# Patient Record
Sex: Male | Born: 1955 | Race: Black or African American | Hispanic: No | Marital: Single | Smoking: Former smoker
Health system: Southern US, Community
[De-identification: ages and names within clinical notes are randomized; demographics above are authoritative.]

## PROBLEM LIST (undated history)

## (undated) DIAGNOSIS — I10 Essential (primary) hypertension: Secondary | ICD-10-CM

## (undated) HISTORY — PX: HYDROCELE EXCISION / REPAIR: SUR1145

## (undated) HISTORY — PX: TONSILLECTOMY: SUR1361

---

## 2004-11-03 ENCOUNTER — Emergency Department: Payer: Self-pay | Admitting: Emergency Medicine

## 2005-01-27 ENCOUNTER — Emergency Department: Payer: Self-pay | Admitting: Emergency Medicine

## 2005-01-27 ENCOUNTER — Other Ambulatory Visit: Payer: Self-pay

## 2007-10-19 ENCOUNTER — Emergency Department: Payer: Self-pay | Admitting: Internal Medicine

## 2010-03-09 ENCOUNTER — Emergency Department: Payer: Self-pay | Admitting: Internal Medicine

## 2012-09-13 ENCOUNTER — Emergency Department: Payer: Self-pay | Admitting: Emergency Medicine

## 2013-09-07 ENCOUNTER — Emergency Department: Payer: Self-pay | Admitting: Emergency Medicine

## 2013-09-07 LAB — CBC
HCT: 40.4 % (ref 40.0–52.0)
HGB: 13.5 g/dL (ref 13.0–18.0)
MCH: 33.8 pg (ref 26.0–34.0)
MCHC: 33.4 g/dL (ref 32.0–36.0)
MCV: 101 fL — AB (ref 80–100)
Platelet: 275 10*3/uL (ref 150–440)
RBC: 4 10*6/uL — ABNORMAL LOW (ref 4.40–5.90)
RDW: 12.5 % (ref 11.5–14.5)
WBC: 15 10*3/uL — ABNORMAL HIGH (ref 3.8–10.6)

## 2013-09-07 LAB — COMPREHENSIVE METABOLIC PANEL
ANION GAP: 4 — AB (ref 7–16)
Albumin: 3.9 g/dL (ref 3.4–5.0)
Alkaline Phosphatase: 90 U/L
BUN: 21 mg/dL — ABNORMAL HIGH (ref 7–18)
Bilirubin,Total: 0.4 mg/dL (ref 0.2–1.0)
Calcium, Total: 9.1 mg/dL (ref 8.5–10.1)
Chloride: 103 mmol/L (ref 98–107)
Co2: 31 mmol/L (ref 21–32)
Creatinine: 1.17 mg/dL (ref 0.60–1.30)
EGFR (African American): >60
EGFR (Non-African Amer.): >60
GLUCOSE: 113 mg/dL — AB (ref 65–99)
Osmolality: 279 (ref 275–301)
Potassium: 4.6 mmol/L (ref 3.5–5.1)
SGOT(AST): 27 U/L (ref 15–37)
SGPT (ALT): 23 U/L (ref 12–78)
SODIUM: 138 mmol/L (ref 136–145)
Total Protein: 7.5 g/dL (ref 6.4–8.2)

## 2013-09-07 LAB — TROPONIN I: Troponin-I: <0.02 ng/mL

## 2014-12-30 ENCOUNTER — Emergency Department: Payer: No Typology Code available for payment source

## 2014-12-30 ENCOUNTER — Encounter: Payer: Self-pay | Admitting: Emergency Medicine

## 2014-12-30 ENCOUNTER — Emergency Department
Admission: EM | Admit: 2014-12-30 | Discharge: 2014-12-30 | Disposition: A | Discharge status: Home / Self Care | Payer: No Typology Code available for payment source | Attending: Emergency Medicine | Admitting: Emergency Medicine

## 2014-12-30 DIAGNOSIS — Z87891 Personal history of nicotine dependence: Secondary | ICD-10-CM | POA: Insufficient documentation

## 2014-12-30 DIAGNOSIS — Y998 Other external cause status: Secondary | ICD-10-CM | POA: Insufficient documentation

## 2014-12-30 DIAGNOSIS — G44219 Episodic tension-type headache, not intractable: Secondary | ICD-10-CM

## 2014-12-30 DIAGNOSIS — S161XXA Strain of muscle, fascia and tendon at neck level, initial encounter: Secondary | ICD-10-CM

## 2014-12-30 DIAGNOSIS — S8992XA Unspecified injury of left lower leg, initial encounter: Secondary | ICD-10-CM | POA: Diagnosis not present

## 2014-12-30 DIAGNOSIS — Z79899 Other long term (current) drug therapy: Secondary | ICD-10-CM | POA: Insufficient documentation

## 2014-12-30 DIAGNOSIS — S59902A Unspecified injury of left elbow, initial encounter: Secondary | ICD-10-CM | POA: Insufficient documentation

## 2014-12-30 DIAGNOSIS — Y9389 Activity, other specified: Secondary | ICD-10-CM | POA: Insufficient documentation

## 2014-12-30 DIAGNOSIS — I10 Essential (primary) hypertension: Secondary | ICD-10-CM | POA: Diagnosis not present

## 2014-12-30 DIAGNOSIS — S39012A Strain of muscle, fascia and tendon of lower back, initial encounter: Secondary | ICD-10-CM | POA: Insufficient documentation

## 2014-12-30 DIAGNOSIS — S199XXA Unspecified injury of neck, initial encounter: Secondary | ICD-10-CM | POA: Diagnosis present

## 2014-12-30 DIAGNOSIS — Y92410 Unspecified street and highway as the place of occurrence of the external cause: Secondary | ICD-10-CM | POA: Insufficient documentation

## 2014-12-30 HISTORY — DX: Essential (primary) hypertension: I10

## 2014-12-30 MED ORDER — NAPROXEN 500 MG PO TBEC: 500.0000 mg | DELAYED_RELEASE_TABLET | Freq: Two times a day (BID) | ORAL | Status: DC

## 2014-12-30 MED ORDER — CYCLOBENZAPRINE HCL 5 MG PO TABS: 5.0000 mg | ORAL_TABLET | Freq: Three times a day (TID) | ORAL | Status: DC | PRN

## 2014-12-30 MED ORDER — ACETAMINOPHEN 500 MG PO TABS
1000.0000 mg | ORAL_TABLET | Freq: Once | ORAL | Status: AC
  Administered 2014-12-30: 1000 mg via ORAL
  Filled 2014-12-30: qty 2
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
## 2014-12-30 NOTE — Discharge Instructions (Signed)
Cervical Sprain A cervical sprain is when the tissues (ligaments) that hold the neck bones in place stretch or tear. HOME CARE   Put ice on the injured area.  Put ice in a plastic bag.  Place a towel between your skin and the bag.  Leave the ice on for 15-20 minutes, 3-4 times a day.  You may have been given a collar to wear. This collar keeps your neck from moving while you heal.  Do not take the collar off unless told by your doctor.  If you have long hair, keep it outside of the collar.  Ask your doctor before changing the position of your collar. You may need to change its position over time to make it more comfortable.  If you are allowed to take off the collar for cleaning or bathing, follow your doctor's instructions on how to do it safely.  Keep your collar clean by wiping it with mild soap and water. Dry it completely. If the collar has removable pads, remove them every 1-2 days to hand wash them with soap and water. Allow them to air dry. They should be dry before you wear them in the collar.  Do not drive while wearing the collar.  Only take medicine as told by your doctor.  Keep all doctor visits as told.  Keep all physical therapy visits as told.  Adjust your work station so that you have good posture while you work.  Avoid positions and activities that make your problems worse.  Warm up and stretch before being active. GET HELP IF:  Your pain is not controlled with medicine.  You cannot take less pain medicine over time as planned.  Your activity level does not improve as expected. GET HELP RIGHT AWAY IF:   You are bleeding.  Your stomach is upset.  You have an allergic reaction to your medicine.  You develop new problems that you cannot explain.  You lose feeling (become numb) or you cannot move any part of your body (paralysis).  You have tingling or weakness in any part of your body.  Your symptoms get worse. Symptoms include:  Pain,  soreness, stiffness, puffiness (swelling), or a burning feeling in your neck.  Pain when your neck is touched.  Shoulder or upper back pain.  Limited ability to move your neck.  Headache.  Dizziness.  Your hands or arms feel week, lose feeling, or tingle.  Muscle spasms.  Difficulty swallowing or chewing. MAKE SURE YOU:   Understand these instructions.  Will watch your condition.  Will get help right away if you are not doing well or get worse.   This information is not intended to replace advice given to you by your health care provider. Make sure you discuss any questions you have with your health care provider.   Document Released: 08/26/2007 Document Revised: 11/09/2012 Document Reviewed: 09/14/2012 Elsevier Interactive Patient Education 2016 Else[employer] Inc[employer]  Lumbosacral Strain Lumbosacral strain is a strain of any of the parts that make up your lumbosacral vertebrae. Your lumbosacral vertebrae are the bones that make up the lower third of your backbone. Your lumbosacral vertebrae are held together by muscles and tough, fibrous tissue (ligaments).  CAUSES  A sudden blow to your back can cause lumbosacral strain. Also, anything that causes an excessive stretch of the muscles in the low back can cause this strain. This is typically seen when people exert themselves strenuously, fall, lift heavy objects, bend, or crouch repeatedly. RISK FACTORS  Physically demanding  work. °· Participation in pushing or pulling sports or sports that require a sudden twist of the back (tennis, golf, baseball). °· Weight lifting. °· Excessive lower back curvature. °· Forward-tilted pelvis. °· Weak back or abdominal muscles or both. °· Tight hamstrings. °SIGNS AND SYMPTOMS  °Lumbosacral strain may cause pain in the area of your injury or pain that moves (radiates) down your leg.  °DIAGNOSIS °Your health care provider can often diagnose lumbosacral strain through a physical exam. In some cases, you may  need tests such as X-ray exams.  °TREATMENT  °Treatment for your lower back injury depends on many factors that your clinician will have to evaluate. However, most treatment will include the use of anti-inflammatory medicines. °HOME CARE INSTRUCTIONS  °· Avoid hard physical activities (tennis, racquetball, waterskiing) if you are not in proper physical condition for it. This may aggravate or create problems. °· If you have a back problem, avoid sports requiring sudden body movements. Swimming and walking are generally safer activities. °· Maintain good posture. °· Maintain a healthy weight. °· For acute conditions, you may put ice on the injured area. °· Put ice in a plastic bag. °· Place a towel between your skin and the bag. °· Leave the ice on for 20 minutes, 2-3 times a day. °· When the low back starts healing, stretching and strengthening exercises may be recommended. °SEEK MEDICAL CARE IF: °· Your back pain is getting worse. °· You experience severe back pain not relieved with medicines. °SEEK IMMEDIATE MEDICAL CARE IF:  °· You have numbness, tingling, weakness, or problems with the use of your arms or legs. °· There is a change in bowel or bladder control. °· You have increasing pain in any area of the body, including your belly (abdomen). °· You notice shortness of breath, dizziness, or feel faint. °· You feel sick to your stomach (nauseous), are throwing up (vomiting), or become sweaty. °· You notice discoloration of your toes or legs, or your feet get very cold. °MAKE SURE YOU:  °· Understand these instructions. °· Will watch your condition. °· Will get help right away if you are not doing well or get worse. °  °This information is not intended to replace advice given to you by your health care provider. Make sure you discuss any questions you have with your health care provider. °  °Document Released: 12/17/2004 Document Revised: 03/30/2014 Document Reviewed: 10/26/2012 °Elsevier Interactive Patient  Education ©2016 Elsevier Inc. ° °Motor Vehicle Collision °After a car crash (motor vehicle collision), it is normal to have bruises and sore muscles. The first 24 hours usually feel the worst. After that, you will likely start to feel better each day. °HOME CARE °· Put ice on the injured area. °¨ Put ice in a plastic bag. °¨ Place a towel between your skin and the bag. °¨ Leave the ice on for 15-20 minutes, 03-04 times a day. °· Drink enough fluids to keep your pee (urine) clear or pale yellow. °· Do not drink alcohol. °· Take a warm shower or bath 1 or 2 times a day. This helps your sore muscles. °· Return to activities as told by your doctor. Be careful when lifting. Lifting can make neck or back pain worse. °· Only take medicine as told by your doctor. Do not use aspirin. °GET HELP RIGHT AWAY IF:  °· Your arms or legs tingle, feel weak, or lose feeling (numbness). °· You have headaches that do not get better with medicine. °· You have   neck pain, especially in the middle of the back of your neck.  You cannot control when you pee (urinate) or poop (bowel movement).  Pain is getting worse in any part of your body.  You are short of breath, dizzy, or pass out (faint).  You have chest pain.  You feel sick to your stomach (nauseous), throw up (vomit), or sweat.  You have belly (abdominal) pain that gets worse.  There is blood in your pee, poop, or throw up.  You have pain in your shoulder (shoulder strap areas).  Your problems are getting worse. MAKE SURE YOU:   Understand these instructions.  Will watch your condition.  Will get help right away if you are not doing well or get worse.   This information is not intended to replace advice given to you by your health care provider. Make sure you discuss any questions you have with your health care provider.   Document Released: 08/26/2007 Document Revised: 06/01/2011 Document Reviewed: 08/06/2010 Elsevier Interactive Patient Education 2016  Elsevier Inc.  Tension Headache A tension headache is pain, pressure, or aching that is felt over the front and sides of your head. These headaches can last from 30 minutes to several days. HOME CARE Managing Pain  Take over-the-counter and prescription medicines only as told by your doctor.  Lie down in a dark, quiet room when you have a headache.  If directed, apply ice to your head and neck area:  Put ice in a plastic bag.  Place a towel between your skin and the bag.  Leave the ice on for 20 minutes, 2-3 times per day.  Use a heating pad or a hot shower to apply heat to your head and neck area as told by your doctor. Eating and Drinking  Eat meals on a regular schedule.  Do not drink a lot of alcohol.  Do not use a lot of caffeine, or stop using caffeine. General Instructions  Keep all follow-up visits as told by your doctor. This is important.  Keep a journal to find out if certain things bring on headaches. For example, write down:  What you eat and drink.  How much sleep you get.  Any change to your diet or medicines.  Try getting a massage, or doing other things that help you to relax.  Lessen stress.  Sit up straight. Do not tighten (tense) your muscles.  Do not use tobacco products. This includes cigarettes, chewing tobacco, or e-cigarettes. If you need help quitting, ask your doctor.  Exercise regularly as told by your doctor.  Get enough sleep. This may mean 7-9 hours of sleep. GET HELP IF:  Your symptoms are not helped by medicine.  You have a headache that feels different from your usual headache.  You feel sick to your stomach (nauseous) or you throw up (vomit).  You have a fever. GET HELP RIGHT AWAY IF:  Your headache becomes very bad.  You keep throwing up.  You have a stiff neck.  You have trouble seeing.  You have trouble speaking.  You have pain in your eye or ear.  Your muscles are weak or you lose muscle control.  You  lose your balance or you have trouble walking.  You feel like you will pass out (faint) or you pass out.  You have confusion.   This information is not intended to replace advice given to you by your health care provider. Make sure you discuss any questions you have with your health  care provider.   Document Released: 06/03/2009 Document Revised: 11/28/2014 Document Reviewed: 07/02/2014 Elsevier Interactive Patient Education [employer]  Take the prescription meds as directed. Follow-up with Dr. Clint Guy as needed.

## 2015-05-08 ENCOUNTER — Encounter: Payer: Self-pay | Admitting: Family Medicine

## 2015-05-21 ENCOUNTER — Encounter: Payer: Self-pay | Admitting: Urology

## 2015-05-21 ENCOUNTER — Ambulatory Visit (INDEPENDENT_AMBULATORY_CARE_PROVIDER_SITE_OTHER): Payer: BLUE CROSS/BLUE SHIELD | Admitting: Urology

## 2015-05-21 VITALS — BP 152/89 | HR 97 | Ht 70.0 in | Wt 232.8 lb

## 2015-05-21 DIAGNOSIS — R350 Frequency of micturition: Secondary | ICD-10-CM | POA: Diagnosis not present

## 2015-05-21 LAB — URINALYSIS, COMPLETE
BILIRUBIN UA: NEGATIVE
GLUCOSE, UA: NEGATIVE
Ketones, UA: NEGATIVE
Leukocytes, UA: NEGATIVE
Nitrite, UA: NEGATIVE
PROTEIN UA: NEGATIVE
RBC UA: NEGATIVE
Specific Gravity, UA: 1.02 (ref 1.005–1.030)
UUROB: 0.2 mg/dL (ref 0.2–1.0)
pH, UA: 5.5 (ref 5.0–7.5)

## 2015-05-21 LAB — MICROSCOPIC EXAMINATION
Bacteria, UA: NONE SEEN
Epithelial Cells (non renal): NONE SEEN /hpf (ref 0–10)
RBC, UA: NONE SEEN /hpf (ref 0–?)
WBC, UA: NONE SEEN /hpf (ref 0–?)

## 2015-05-21 LAB — BLADDER SCAN AMB NON-IMAGING

## 2015-05-21 NOTE — Progress Notes (Signed)
05/21/2015 10:40 AM   Elijah Wagner 02-Apr-1955 [id number]  Referring provider: No referring provider defined for this encounter.  Chief Complaint  Patient presents with  . Benign Prostatic Hypertrophy    New Patient    HPI: Elijah Wagner is a 60yo seen today for urinary frequency and nocturia.  He has nocturia 2-3x. He has frequency q2hr.  No hesistancy, urgency, incontinence. NO hematuria, dysuria.  He is not bothered by his symptoms. He is not interested in BPH meds He has occasional issues getting an erection which bothers him. He has not tried any medication. NO issues with CAD. He was recently diagnosed with HTN. No hx of diabetes. The pt says he is unsure whether he has had a PSA drawn  Father died at age 76 from leukemia.      PMH: Past Medical History  Diagnosis Date  . Hypertension     Surgical History: Past Surgical History  Procedure Laterality Date  . None      Home Medications:    Medication List       This list is accurate as of: 05/21/15 10:40 AM.  Always use your most recent med list.               cyclobenzaprine 5 MG tablet  Commonly known as:  FLEXERIL  Take 1 tablet (5 mg total) by mouth every 8 (eight) hours as needed for muscle spasms.     lisinopril 10 MG tablet  Commonly known as:  PRINIVIL,ZESTRIL  Take 10 mg by mouth daily.     naproxen 500 MG EC tablet  Commonly known as:  EC NAPROSYN  Take 1 tablet (500 mg total) by mouth 2 (two) times daily with a meal.        Allergies: No Known Allergies  Family History: Family History  Problem Relation Age of Onset  . Bladder Cancer Neg Hx   . Prostate cancer Neg Hx   . Kidney cancer Neg Hx     Social History:  reports that he has quit smoking. He does not have any smokeless tobacco history on file. He reports that he does not drink alcohol. His drug history is not on file.  ROS: UROLOGY Frequent Urination?: Yes Hard to postpone urination?: No Burning/pain with  urination?: No Get up at night to urinate?: Yes Leakage of urine?: No Urine stream starts and stops?: No Trouble starting stream?: No Do you have to strain to urinate?: No Blood in urine?: No Urinary tract infection?: No Sexually transmitted disease?: No Injury to kidneys or bladder?: No Painful intercourse?: No Weak stream?: No Erection problems?: No Penile pain?: No  Gastrointestinal Nausea?: No Vomiting?: No Indigestion/heartburn?: No Diarrhea?: No Constipation?: No  Constitutional Fever: No Night sweats?: No Weight loss?: No Fatigue?: No  Skin Skin rash/lesions?: No Itching?: No  Eyes Blurred vision?: No Double vision?: No  Ears/Nose/Throat Sore throat?: No Sinus problems?: Yes  Hematologic/Lymphatic Swollen glands?: No Easy bruising?: No  Cardiovascular Leg swelling?: No Chest pain?: No  Respiratory Cough?: No Shortness of breath?: No  Endocrine Excessive thirst?: No  Musculoskeletal Back pain?: Yes Joint pain?: No  Neurological Headaches?: No Dizziness?: No  Psychologic Depression?: No Anxiety?: No  Physical Exam: BP 152/89 mmHg  Pulse 97  Ht  (1.778 m)  Wt 105.597 kg (232 lb 12.8 oz)  BMI 33.40 kg/m2  Constitutional:  Alert and oriented, No acute distress. HEENT: [place] AT, moist mucus membranes.  Trachea midline, no masses. Cardiovascular: No clubbing, cyanosis, or  edema. Respiratory: Normal respiratory effort, no increased work of breathing. GI: Abdomen is soft, nontender, nondistended, no abdominal masses GU: No CVA tenderness. circumicsed phallus. No masses/lesions on penis, testis, scrotum. Prostate smooth 40g, no nodules, no induration Skin: No rashes, bruises or suspicious lesions. Lymph: No cervical or inguinal adenopathy. Neurologic: Grossly intact, no focal deficits, moving all 4 extremities. Psychiatric: Normal mood and affect.  Laboratory Data: Lab Results  Component Value Date   WBC 15.0* 09/07/2013   HGB  13.5 09/07/2013   HCT 40.4 09/07/2013   MCV 101* 09/07/2013   PLT 275 09/07/2013    Lab Results  Component Value Date   CREATININE 1.17 09/07/2013    No results found for: PSA  No results found for: TESTOSTERONE  No results found for: HGBA1C  Urinalysis No results found for: COLORURINE, APPEARANCEUR, LABSPEC, PHURINE, GLUCOSEU, HGBUR, BILIRUBINUR, KETONESUR, PROTEINUR, UROBILINOGEN, NITRITE, LEUKOCYTESUR  Pertinent Imaging: none  Assessment & Plan:    1. BPH with LUTS, urinary frequency -PSA today -RTC 1 year - Urinalysis, Complete - BLADDER SCAN AMB NON-IMAGING   No Follow-up on file.  Malen Gauze, MD  [facility] [address], [address] [place], [place] [address] [phone]

## 2015-05-22 LAB — PSA, TOTAL AND FREE
PSA FREE PCT: 32.2 %
PSA, Free: 0.29 ng/mL
Prostate Specific Ag, Serum: 0.9 ng/mL (ref 0.0–4.0)

## 2015-05-27 ENCOUNTER — Telehealth: Payer: Self-pay

## 2015-05-29 NOTE — Telephone Encounter (Signed)
Encounter open in error 

## 2016-05-20 ENCOUNTER — Ambulatory Visit: Payer: BLUE CROSS/BLUE SHIELD | Admitting: Urology

## 2016-06-02 ENCOUNTER — Ambulatory Visit: Payer: BLUE CROSS/BLUE SHIELD | Admitting: Urology

## 2016-07-01 ENCOUNTER — Ambulatory Visit: Payer: BLUE CROSS/BLUE SHIELD | Admitting: Urology

## 2016-07-15 NOTE — Progress Notes (Signed)
07/16/2016 10:41 AM   Elijah Wagner 15-Mar-1956 [id number]  Referring provider: Armando Gang, FNP [address] [place], [place] [address]  Chief Complaint  Patient presents with  . Urinary Frequency    1 year follow up   . Benign Prostatic Hypertrophy    HPI: 61 yo AAM with BPH and LU TS and ED who presents today for a one year follow up.  BPH WITH LUTS His IPSS score today is 1, which is mild lower urinary tract symptomatology. He is mixed with his quality life due to his urinary symptoms.  His major complains today are frequency.  He has had these symptoms for a long time.  He denies any dysuria, hematuria or suprapubic pain.  He also denies any recent fevers, chills, nausea or vomiting.  He does not have a family history of PCa.      IPSS    Row Name 07/16/16 1000         International Prostate Symptom Score   How often have you had the sensation of not emptying your bladder? Not at All     How often have you had to urinate less than every two hours? Less than 1 in 5 times     How often have you found you stopped and started again several times when you urinated? Not at All     How often have you found it difficult to postpone urination? Not at All     How often have you had a weak urinary stream? Not at All     How often have you had to strain to start urination? Not at All     How many times did you typically get up at night to urinate? None     Total IPSS Score 1       Quality of Life due to urinary symptoms   If you were to spend the rest of your life with your urinary condition just the way it is now how would you feel about that? Mixed        Score:  1-7 Mild 8-19 Moderate 20-35 Severe   Erectile dysfunction His SHIM score is 20, which is mild ED.   He has been having difficulty with erections for a while.   His major complaint is inconsitency with erections.  His libido is preserved.   His risk factors for ED are age, BPH and HTN.  He denies any  painful erections or curvatures with his erections.   He is still having having spontaneous erections.  He has tried Viagra in the past, but the results are equivocal.          SHIM    Row Name 07/16/16 1020         SHIM: Over the last 6 months:   How do you rate your confidence that you could get and keep an erection? Moderate     When you had erections with sexual stimulation, how often were your erections hard enough for penetration (entering your partner)? Most Times (much more than half the time)     During sexual intercourse, how often were you able to maintain your erection after you had penetrated (entered) your partner? Most Times (much more than half the time)     During sexual intercourse, how difficult was it to maintain your erection to completion of intercourse? Slightly Difficult     When you attempted sexual intercourse, how often was it satisfactory for you? Almost Always or Always  SHIM Total Score   SHIM 20        Score: 1-7 Severe ED 8-11 Moderate ED 12-16 Mild-Moderate ED 17-21 Mild ED 22-25 No ED     PMH: Past Medical History:  Diagnosis Date  . Hypertension     Surgical History: Past Surgical History:  Procedure Laterality Date  . HYDROCELE EXCISION / REPAIR    . TONSILLECTOMY      Home Medications:  Allergies as of 07/16/2016   No Known Allergies     Medication List       Accurate as of 07/16/16 10:41 AM. Always use your most recent med list.          cyclobenzaprine 5 MG tablet Commonly known as:  FLEXERIL Take 1 tablet (5 mg total) by mouth every 8 (eight) hours as needed for muscle spasms.   lisinopril 10 MG tablet Commonly known as:  PRINIVIL,ZESTRIL Take 10 mg by mouth daily.   naproxen 500 MG EC tablet Commonly known as:  EC NAPROSYN Take 1 tablet (500 mg total) by mouth 2 (two) times daily with a meal.       Allergies: No Known Allergies  Family History: Family History  Problem Relation Age of Onset  .  Leukemia Father   . Bladder Cancer Neg Hx   . Prostate cancer Neg Hx   . Kidney cancer Neg Hx     Social History:  reports that he quit smoking about 3 years ago. He has never used smokeless tobacco. He reports that he does not drink alcohol or use drugs.  ROS: UROLOGY Frequent Urination?: Yes Hard to postpone urination?: No Burning/pain with urination?: No Get up at night to urinate?: No Leakage of urine?: No Urine stream starts and stops?: No Trouble starting stream?: No Do you have to strain to urinate?: No Blood in urine?: No Urinary tract infection?: No Sexually transmitted disease?: No Injury to kidneys or bladder?: No Painful intercourse?: No Weak stream?: No Erection problems?: No Penile pain?: No  Gastrointestinal Nausea?: No Vomiting?: No Indigestion/heartburn?: No Diarrhea?: No Constipation?: No  Constitutional Fever: No Night sweats?: No Weight loss?: No Fatigue?: No  Skin Skin rash/lesions?: No Itching?: No  Eyes Blurred vision?: No Double vision?: No  Ears/Nose/Throat Sore throat?: No Sinus problems?: Yes  Hematologic/Lymphatic Swollen glands?: No Easy bruising?: No  Cardiovascular Leg swelling?: No Chest pain?: No  Respiratory Cough?: No Shortness of breath?: No  Endocrine Excessive thirst?: No  Musculoskeletal Back pain?: No Joint pain?: No  Neurological Headaches?: No Dizziness?: No  Psychologic Depression?: No Anxiety?: No  Physical Exam: BP 136/90   Pulse 85   Ht  (1.803 m)   Wt 232 lb 8 oz (105.5 kg)   BMI 32.43 kg/m   Constitutional: Well nourished. Alert and oriented, No acute distress. HEENT: [place] AT, moist mucus membranes. Trachea midline, no masses. Cardiovascular: No clubbing, cyanosis, or edema. Respiratory: Normal respiratory effort, no increased work of breathing. GI: Abdomen is soft, non tender, non distended, no abdominal masses. Liver and spleen not palpable.  No hernias appreciated.  Stool  sample for occult testing is not indicated.   GU: No CVA tenderness.  No bladder fullness or masses.  Patient with circumcised phallus.   Urethral meatus is patent.  No penile discharge. No penile lesions or rashes. Scrotum without lesions, cysts, rashes and/or edema.  Testicles are located scrotally bilaterally. No masses are appreciated in the testicles. Left and right epididymis are normal. Rectal: Patient with  normal sphincter  tone. Anus and perineum without scarring or rashes. No rectal masses are appreciated. Prostate is approximately 45 grams, no nodules are appreciated. Seminal vesicles are normal. Skin: No rashes, bruises or suspicious lesions. Lymph: No cervical or inguinal adenopathy. Neurologic: Grossly intact, no focal deficits, moving all 4 extremities. Psychiatric: Normal mood and affect.  Laboratory Data: PSA History  0.9 ng/mL on 05/21/2015   Lab Results  Component Value Date   WBC 15.0 (H) 09/07/2013   HGB 13.5 09/07/2013   HCT 40.4 09/07/2013   MCV 101 (H) 09/07/2013   PLT 275 09/07/2013    Lab Results  Component Value Date   CREATININE 1.17 09/07/2013     Assessment & Plan:    1. BPH with LUTS  - IPSS score is 1/3  - Continue conservative management, avoiding bladder irritants and timed voiding's  - most bothersome symptoms is/are frequency  - patient not interested in medications at this time  - RTC in 12 months for IPSS, PSA and exam    2. Erectile dysfunction  - SHIM score is 20  - I explained to the patient that in order to achieve an erection it takes good functioning of the nervous system (parasympathetic, sympathetic, sensory and motor), good blood flow into the erectile tissue of the penis and a desire to have sex  - I explained that conditions like diabetes, hypertension, coronary artery disease, peripheral vascular disease, smoking, alcohol consumption, age, sleep apnea and BPH can diminish the ability to have an erection  - A recent study  published in Sex Med 2018 Apr 13 revealed moderate to vigorous aerobic exercise for 40 minutes 4 times per week can decrease erectile problems caused by physical inactivity, obesity, hypertension, metabolic syndrome and/or cardiovascular diseases  - RTC in 12 months for repeat SHIM score and exam   Return in about 1 year (around 07/16/2017) for IPSS, SHIM, PSA and exam.  Michiel Cowboy, [facility]  [facility] [address] [place], [place] [address] [phone]

## 2016-07-16 ENCOUNTER — Ambulatory Visit: Payer: BLUE CROSS/BLUE SHIELD | Admitting: Urology

## 2016-07-16 ENCOUNTER — Encounter: Payer: Self-pay | Admitting: Urology

## 2016-07-16 VITALS — BP 136/90 | HR 85 | Ht 71.0 in | Wt 232.5 lb

## 2016-07-16 DIAGNOSIS — N529 Male erectile dysfunction, unspecified: Secondary | ICD-10-CM

## 2016-07-16 DIAGNOSIS — N138 Other obstructive and reflux uropathy: Secondary | ICD-10-CM

## 2016-07-16 DIAGNOSIS — N401 Enlarged prostate with lower urinary tract symptoms: Secondary | ICD-10-CM

## 2016-07-17 ENCOUNTER — Telehealth: Payer: Self-pay

## 2016-07-17 LAB — PSA: Prostate Specific Ag, Serum: 0.8 ng/mL (ref 0.0–4.0)

## 2016-07-17 NOTE — Telephone Encounter (Signed)
-----   Message from Harle Battiest, [name] sent at 07/17/2016  8:16 AM EDT ----- Patient's PSA is stable at 0.8.   We will see him in 12 months.  PSA to be drawn before his next appointment.

## 2016-07-17 NOTE — Telephone Encounter (Signed)
Spoke with pt in reference to PSA results. Pt voiced understanding.  

## 2016-10-10 ENCOUNTER — Emergency Department (HOSPITAL_COMMUNITY): Payer: BLUE CROSS/BLUE SHIELD

## 2016-10-10 ENCOUNTER — Observation Stay (HOSPITAL_COMMUNITY): Payer: BLUE CROSS/BLUE SHIELD

## 2016-10-10 ENCOUNTER — Encounter (HOSPITAL_COMMUNITY): Payer: Self-pay | Admitting: Emergency Medicine

## 2016-10-10 ENCOUNTER — Observation Stay (HOSPITAL_COMMUNITY)
Admission: EM | Admit: 2016-10-10 | Discharge: 2016-10-10 | Disposition: A | Discharge status: Home / Self Care | Payer: BLUE CROSS/BLUE SHIELD | Attending: Internal Medicine | Admitting: Internal Medicine

## 2016-10-10 DIAGNOSIS — R55 Syncope and collapse: Secondary | ICD-10-CM

## 2016-10-10 DIAGNOSIS — I700 Atherosclerosis of aorta: Secondary | ICD-10-CM | POA: Insufficient documentation

## 2016-10-10 DIAGNOSIS — D72829 Elevated white blood cell count, unspecified: Secondary | ICD-10-CM | POA: Diagnosis not present

## 2016-10-10 DIAGNOSIS — E860 Dehydration: Secondary | ICD-10-CM | POA: Diagnosis not present

## 2016-10-10 DIAGNOSIS — K115 Sialolithiasis: Secondary | ICD-10-CM | POA: Diagnosis not present

## 2016-10-10 DIAGNOSIS — I119 Hypertensive heart disease without heart failure: Secondary | ICD-10-CM | POA: Diagnosis not present

## 2016-10-10 DIAGNOSIS — Z87891 Personal history of nicotine dependence: Secondary | ICD-10-CM | POA: Insufficient documentation

## 2016-10-10 DIAGNOSIS — Z79899 Other long term (current) drug therapy: Secondary | ICD-10-CM | POA: Diagnosis not present

## 2016-10-10 DIAGNOSIS — I10 Essential (primary) hypertension: Secondary | ICD-10-CM | POA: Diagnosis present

## 2016-10-10 LAB — CBC WITH DIFFERENTIAL/PLATELET
BASOS ABS: 0.1 10*3/uL (ref 0.0–0.1)
Basophils Relative: 0 %
EOS PCT: 1 %
Eosinophils Absolute: 0.1 10*3/uL (ref 0.0–0.7)
HCT: 37.7 % — ABNORMAL LOW (ref 39.0–52.0)
Hemoglobin: 12.5 g/dL — ABNORMAL LOW (ref 13.0–17.0)
LYMPHS PCT: 13 %
Lymphs Abs: 1.9 10*3/uL (ref 0.7–4.0)
MCH: 32.1 pg (ref 26.0–34.0)
MCHC: 33.2 g/dL (ref 30.0–36.0)
MCV: 96.9 fL (ref 78.0–100.0)
MONO ABS: 0.9 10*3/uL (ref 0.1–1.0)
Monocytes Relative: 6 %
Neutro Abs: 12.3 10*3/uL — ABNORMAL HIGH (ref 1.7–7.7)
Neutrophils Relative %: 80 %
PLATELETS: 239 10*3/uL (ref 150–400)
RBC: 3.89 MIL/uL — ABNORMAL LOW (ref 4.22–5.81)
RDW: 12.3 % (ref 11.5–15.5)
WBC: 15.3 10*3/uL — ABNORMAL HIGH (ref 4.0–10.5)

## 2016-10-10 LAB — RAPID URINE DRUG SCREEN, HOSP PERFORMED
Amphetamines: NOT DETECTED
Barbiturates: NOT DETECTED
Benzodiazepines: NOT DETECTED
COCAINE: NOT DETECTED
OPIATES: NOT DETECTED
Tetrahydrocannabinol: NOT DETECTED

## 2016-10-10 LAB — COMPREHENSIVE METABOLIC PANEL
ALT: 16 U/L — ABNORMAL LOW (ref 17–63)
AST: 22 U/L (ref 15–41)
Albumin: 3.8 g/dL (ref 3.5–5.0)
Alkaline Phosphatase: 67 U/L (ref 38–126)
Anion gap: 5 (ref 5–15)
BUN: 16 mg/dL (ref 6–20)
CHLORIDE: 106 mmol/L (ref 101–111)
CO2: 27 mmol/L (ref 22–32)
Calcium: 9.1 mg/dL (ref 8.9–10.3)
Creatinine, Ser: 1.35 mg/dL — ABNORMAL HIGH (ref 0.61–1.24)
GFR, EST NON AFRICAN AMERICAN: 55 mL/min — AB (ref >60)
Glucose, Bld: 138 mg/dL — ABNORMAL HIGH (ref 65–99)
POTASSIUM: 3.8 mmol/L (ref 3.5–5.1)
Sodium: 138 mmol/L (ref 135–145)
Total Bilirubin: 0.7 mg/dL (ref 0.3–1.2)
Total Protein: 6.3 g/dL — ABNORMAL LOW (ref 6.5–8.1)

## 2016-10-10 LAB — GLUCOSE, CAPILLARY: Glucose-Capillary: 166 mg/dL — ABNORMAL HIGH (ref 65–99)

## 2016-10-10 LAB — CBC
HCT: 37.6 % — ABNORMAL LOW (ref 39.0–52.0)
Hemoglobin: 13.2 g/dL (ref 13.0–17.0)
MCH: 33.8 pg (ref 26.0–34.0)
MCHC: 35.1 g/dL (ref 30.0–36.0)
MCV: 96.4 fL (ref 78.0–100.0)
PLATELETS: 269 10*3/uL (ref 150–400)
RBC: 3.9 MIL/uL — ABNORMAL LOW (ref 4.22–5.81)
RDW: 12.5 % (ref 11.5–15.5)
WBC: 15.2 10*3/uL — ABNORMAL HIGH (ref 4.0–10.5)

## 2016-10-10 LAB — BASIC METABOLIC PANEL
Anion gap: 8 (ref 5–15)
BUN: 16 mg/dL (ref 6–20)
CALCIUM: 9.1 mg/dL (ref 8.9–10.3)
CHLORIDE: 103 mmol/L (ref 101–111)
CO2: 25 mmol/L (ref 22–32)
CREATININE: 1.23 mg/dL (ref 0.61–1.24)
GFR calc Af Amer: >60 mL/min (ref >60)
GFR calc non Af Amer: >60 mL/min (ref >60)
Glucose, Bld: 137 mg/dL — ABNORMAL HIGH (ref 65–99)
Potassium: 4.5 mmol/L (ref 3.5–5.1)
SODIUM: 136 mmol/L (ref 135–145)

## 2016-10-10 LAB — ECHOCARDIOGRAM COMPLETE
Height: 69.5 in
WEIGHTICAEL: 3539.2 [oz_av]

## 2016-10-10 LAB — I-STAT TROPONIN, ED: TROPONIN I, POC: 0 ng/mL (ref 0.00–0.08)

## 2016-10-10 LAB — PROCALCITONIN: Procalcitonin: <0.1 ng/mL

## 2016-10-10 LAB — CBG MONITORING, ED: GLUCOSE-CAPILLARY: 98 mg/dL (ref 65–99)

## 2016-10-10 LAB — D-DIMER, QUANTITATIVE: D-Dimer, Quant: <0.27 ug/mL-FEU (ref 0.00–0.50)

## 2016-10-10 MED ORDER — ONDANSETRON HCL 4 MG/2ML IJ SOLN
4.0000 mg | Freq: Once | INTRAMUSCULAR | Status: AC
  Administered 2016-10-10: 4 mg via INTRAVENOUS
  Filled 2016-10-10: qty 2

## 2016-10-10 MED ORDER — ONDANSETRON HCL 4 MG PO TABS
4.0000 mg | ORAL_TABLET | Freq: Four times a day (QID) | ORAL | Status: DC | PRN
Start: 2016-10-10 — End: 2016-10-10

## 2016-10-10 MED ORDER — ONDANSETRON HCL 4 MG/2ML IJ SOLN
4.0000 mg | Freq: Once | INTRAMUSCULAR | Status: AC
  Administered 2016-10-10: 4 mg via INTRAVENOUS

## 2016-10-10 MED ORDER — SODIUM CHLORIDE 0.9 % IV SOLN
INTRAVENOUS | Status: DC
  Administered 2016-10-10: 05:00:00 via INTRAVENOUS

## 2016-10-10 MED ORDER — ENOXAPARIN SODIUM 40 MG/0.4ML ~~LOC~~ SOLN
40.0000 mg | SUBCUTANEOUS | Status: DC
  Filled 2016-10-10: qty 0.4

## 2016-10-10 MED ORDER — LISINOPRIL 10 MG PO TABS: 10.0000 mg | ORAL_TABLET | Freq: Every day | ORAL | Status: DC

## 2016-10-10 MED ORDER — ONDANSETRON HCL 4 MG/2ML IJ SOLN: 4.0000 mg | Freq: Four times a day (QID) | INTRAMUSCULAR | Status: DC | PRN

## 2016-10-10 MED ORDER — ACETAMINOPHEN 650 MG RE SUPP: 650.0000 mg | Freq: Four times a day (QID) | RECTAL | Status: DC | PRN

## 2016-10-10 MED ORDER — ONDANSETRON HCL 4 MG/2ML IJ SOLN
INTRAMUSCULAR | Status: AC
  Filled 2016-10-10: qty 2

## 2016-10-10 MED ORDER — SODIUM CHLORIDE 0.9% FLUSH: 3.0000 mL | Freq: Two times a day (BID) | INTRAVENOUS | Status: DC

## 2016-10-10 MED ORDER — ACETAMINOPHEN 325 MG PO TABS
650.0000 mg | ORAL_TABLET | Freq: Four times a day (QID) | ORAL | Status: DC | PRN
  Administered 2016-10-10: 650 mg via ORAL
  Filled 2016-10-10: qty 2

## 2016-10-10 NOTE — Progress Notes (Signed)
Patient discharged home with family. Telemetry and IV discontinued. Discharge information given. Patient questions asked and answered. Work letter from MD also provided to patient. Will be leaving unit via wheelchair with staff. Lawson RadarHeather M Latifah Padin

## 2016-10-10 NOTE — Discharge Summary (Signed)
Physician Discharge Summary   Patient ID: Elijah Wagner MRN: [id number] DOB/AGE: 1955/11/01 61 y.o.  Admit date: 10/10/2016 Discharge date: 10/10/2016  Primary Care Physician:  Armando Gang, FNP  Discharge Diagnoses:    . Syncope . Essential hypertension . Leukocytosis   Consults:  None   Recommendations for Outpatient Follow-up:   1. Please repeat CBC/BMET at next visit    DIET heart healthy diet     Allergies:  No Known Allergies   DISCHARGE MEDICATIONS: Current Discharge Medication List    CONTINUE these medications which have CHANGED   Details  lisinopril (PRINIVIL,ZESTRIL) 10 MG tablet Take 1 tablet (10 mg total) by mouth daily. Please HOLD for next 3 days         Brief H and P: For complete details please refer to admission H and P, but in brief Patient is a 61 year old male with hypertension presented with a syncopal episode. Patient reported that he was having fried oysters from [employer] for dinner and after eating the last was started he felt nauseated, lightheaded, profusely sweaty and passed out. He had 3 syncopal episodes, and was brought to ED by EMS. In ED, BP was 97/69. WBC 15.3. Troponin negative, CT head negative, EKG normal. Per wife, patient had a few seconds of jerking during the first episode and he micturated himself.  Hospital Course:  Syncope - Likely a vasovagal episode and orthostatic positive on admission.  - CT head negative - Patient was placed on IV fluid hydration - 2-D echo showed EF of 55-60%, no WMA, grade 1 diastolic dysfunction  - EEG was normal  - No arrhythmias on telemetry, troponin negative, d-dimer less than 0.27, EKG normal - Lisinopril was held. Patient was recommended to hold lisinopril for another few days and check his BP daily until normal.     Essential hypertension - BP was borderline hypotensive due to orthostasis and dehydration. Hold lisinopril    Leukocytosis -Possibly stress-induced  margination, no infectious etiology, no abdominal pain nausea vomiting to suggest any colitis. - Chest x-ray clear, no pneumonia. Pro-calcitonin <0.10, rules out infectious etiology    Day of Discharge BP 118/61 (BP Location: Right Arm)   Pulse 79   Temp 98.6 F (37 C) (Oral)   Resp 18   Ht 5' 9.5" (1.765 m)   Wt 100.3 kg (221 lb 3.2 oz)   SpO2 97%   BMI 32.20 kg/m   Physical Exam: General: Alert and awake oriented x3 not in any acute distress. HEENT: anicteric sclera, pupils reactive to light and accommodation CVS: S1-S2 clear no murmur rubs or gallops Chest: clear to auscultation bilaterally, no wheezing rales or rhonchi Abdomen: soft nontender, nondistended, normal bowel sounds Extremities: no cyanosis, clubbing or edema noted bilaterally Neuro: Cranial nerves II-XII intact, no focal neurological deficits   The results of significant diagnostics from this hospitalization (including imaging, microbiology, ancillary and laboratory) are listed below for reference.    LAB RESULTS: Basic Metabolic Panel:  Recent Labs Lab 10/10/16 0100 10/10/16 0443  NA 138 136  K 3.8 4.5  CL 106 103  CO2 27 25  GLUCOSE 138* 137*  BUN 16 16  CREATININE 1.35* 1.23  CALCIUM 9.1 9.1   Liver Function Tests:  Recent Labs Lab 10/10/16 0100  AST 22  ALT 16*  ALKPHOS 67  BILITOT 0.7  PROT 6.3*  ALBUMIN 3.8   No results for input(s): LIPASE, AMYLASE in the last 168 hours. No results for input(s): AMMONIA in the last 168  hours. CBC:  Recent Labs Lab 10/10/16 0100 10/10/16 0443  WBC 15.3* 15.2*  NEUTROABS 12.3*  --   HGB 12.5* 13.2  HCT 37.7* 37.6*  MCV 96.9 96.4  PLT 239 269   Cardiac Enzymes: No results for input(s): CKTOTAL, CKMB, CKMBINDEX, TROPONINI in the last 168 hours. BNP: Invalid input(s): POCBNP CBG:  Recent Labs Lab 10/10/16 0224 10/10/16 0634  GLUCAP 98 166*    Significant Diagnostic Studies:  Dg Chest 2 View  Result Date: 10/10/2016 CLINICAL  DATA:  Weakness and headache x1 day. EXAM: CHEST  2 VIEW COMPARISON:  01/27/2005 FINDINGS: Cardiomegaly with tortuous ectatic thoracic aorta. Minimal aortic atherosclerosis. No pneumonic consolidation, effusion or pneumothorax. No acute nor suspicious osseous abnormalities. Degenerative joint space narrowing spurring about both AC and glenohumeral joints. IMPRESSION: Stable cardiomegaly with tortuous ectatic thoracic aorta. Aortic atherosclerosis. No acute pulmonary disease. Electronically Signed   By: Tollie Ethavid  Kwon M.D.   On: 10/10/2016 02:35   Ct Head Wo Contrast  Result Date: 10/10/2016 CLINICAL DATA:  Headache, dizziness and nausea. Syncopal episodes x1. EXAM: CT HEAD WITHOUT CONTRAST TECHNIQUE: Contiguous axial images were obtained from the base of the skull through the vertex without intravenous contrast. COMPARISON:  12/30/2014 FINDINGS: Brain: No evidence of acute infarction, hemorrhage, hydrocephalus, extra-axial collection or mass lesion/mass effect. Vascular: No hyperdense vessel or unexpected calcification. Skull: Normal. Negative for fracture or focal lesion. Sinuses/Orbits: No acute finding. Other: A few calcifications are noted of the left parotid gland that may represent sialoliths. IMPRESSION: 1. No acute intracranial abnormality. 2. Nonobstructing sialoliths of the left parotid gland. Electronically Signed   By: Tollie Ethavid  Kwon M.D.   On: 10/10/2016 02:27    2D ECHO: Study Conclusions  - Left ventricle: The cavity size was normal. Wall thickness was   increased in a pattern of mild LVH. Systolic function was normal.   The estimated ejection fraction was in the range of 55% to 60%.   Wall motion was normal; there were no regional wall motion   abnormalities. Doppler parameters are consistent with abnormal   left ventricular relaxation (grade 1 diastolic dysfunction). - Tricuspid valve: There was mild regurgitation. - Pulmonary arteries: Systolic pressure was mildly increased. PA   peak  pressure: 35 mm Hg (S).  Disposition and Follow-up: Discharge Instructions    Diet - low sodium heart healthy    Complete by:  As directed    Discharge instructions    Complete by:  As directed    Please HOLD lisinopril for the next 3 days. Please check your BP daily. If your BP starts running in 130-140's, you can resume the lisinopril.  Stay hydrated.   Increase activity slowly    Complete by:  As directed        DISPOSITION: home    DISCHARGE FOLLOW-UP Follow-up Information    Armando GangLindley, Cheryl P, FNP. Schedule an appointment as soon as possible for a visit in 1 week(s).   Specialty:  Family Medicine Why:  for hospital follow-up  Contact information: [address] [place] [id number] 314 [phone]            Time spent on Discharge: 25mins   Signed:   Thad Rangeripudeep Rai M.D. [facility] 10/10/2016, 4:08 PM Pager: [phone]

## 2016-10-10 NOTE — ED Provider Notes (Signed)
[facility]-EMERGENCY DEPT Provider Note   CSN: [id number] Arrival date & time: 10/10/16  4098 By signing my name below, I, Levon Hedger, attest that this documentation has been prepared under the direction and in the presence of Melene Plan, DO . Electronically Signed: Levon Hedger, Scribe. 10/10/2016. 1:59 AM.   History   Chief Complaint Chief Complaint  Patient presents with  . Seizures  . Hypotension   HPI Elijah Wagner is a 61 y.o. male brought in by ambulance who presents to the Emergency Department for further evaluation of three episodes of seizure-like activity tonight PTA. Pt's wife states they were having dinner when he reported some abdominal pain and felt as if he was going to pass out. She went to get a cold rag and when she returned, pt was on the floor. Wife describes his activity as rigid stiffness with rhythmic jerking that lasted for about 3-5 seconds. He then had two more episodes of the same. Wife also  reports associated urinary incontinence, confusion and fatigue s/p syncopal episode. After waking from this, pt was confused and disoriented about why he was lying on the ground. Pt denies any SOB, fever, or hematochezia. Pt has no other acute complaints or associated symptoms at this time.     The history is provided by the patient. No language interpreter was used.   Past Medical History:  Diagnosis Date  . Hypertension     Patient Active Problem List   Diagnosis Date Noted  . Syncope 10/10/2016  . Essential hypertension 10/10/2016  . Leukocytosis 10/10/2016    Past Surgical History:  Procedure Laterality Date  . HYDROCELE EXCISION / REPAIR    . TONSILLECTOMY      Home Medications    Prior to Admission medications   Medication Sig Start Date End Date Taking? Authorizing Provider  cyclobenzaprine (FLEXERIL) 5 MG tablet Take 1 tablet (5 mg total) by mouth every 8 (eight) hours as needed for muscle spasms. Patient not taking: Reported on 07/16/2016 12/30/14    Menshew, Charlesetta Ivory, [name]  lisinopril (PRINIVIL,ZESTRIL) 10 MG tablet Take 10 mg by mouth daily.    [provider]  naproxen (EC NAPROSYN) 500 MG EC tablet Take 1 tablet (500 mg total) by mouth 2 (two) times daily with a meal. Patient not taking: Reported on 07/16/2016 12/30/14   Menshew, Charlesetta Ivory, [name]    Family History Family History  Problem Relation Age of Onset  . Leukemia Father   . Sarcoidosis Mother   . Diabetes Mother   . Bladder Cancer Neg Hx   . Prostate cancer Neg Hx   . Kidney cancer Neg Hx   . Sudden Cardiac Death Neg Hx   . Arrhythmia Neg Hx   . Heart attack Neg Hx     Social History Social History  Substance Use Topics  . Smoking status: Former Smoker    Quit date: 2015  . Smokeless tobacco: Never Used  . Alcohol use No     Allergies   Patient has no known allergies.   Review of Systems Review of Systems  Constitutional: Negative for chills and fever.  HENT: Negative for congestion and facial swelling.   Eyes: Negative for discharge.  Respiratory: Negative for shortness of breath.   Cardiovascular: Negative for palpitations.  Gastrointestinal: Positive for abdominal pain.  Musculoskeletal: Negative for arthralgias and myalgias.  Skin: Negative for color change and rash.  Neurological: Positive for syncope. Negative for tremors.  Psychiatric/Behavioral: Positive for confusion. Negative for dysphoric  mood.   Physical Exam Updated Vital Signs BP (!) 104/55 (BP Location: Right Arm)   Pulse 71   Temp 98.2 F (36.8 C) (Oral)   Resp 20   Ht 5' 9.5" (1.765 m)   Wt 100.3 kg (221 lb 3.2 oz)   SpO2 98%   BMI 32.20 kg/m   Physical Exam  Constitutional: He is oriented to person, place, and time. He appears well-developed and well-nourished.  HENT:  Head: Normocephalic and atraumatic.  Eyes: Pupils are equal, round, and reactive to light. EOM are normal.  Neck: Normal range of motion. Neck supple. No JVD present.  Cardiovascular:  Normal rate, regular rhythm and normal heart sounds.  Exam reveals no gallop and no friction rub.   No murmur heard. Pulmonary/Chest: Effort normal and breath sounds normal. No respiratory distress. He has no wheezes.  Abdominal: Soft. Bowel sounds are normal. He exhibits no distension. There is no rebound and no guarding.  Musculoskeletal: Normal range of motion.  Neurological: He is alert and oriented to person, place, and time.  Mildly sleepy upon exam. Upper and lower extremity strength is normal.   Skin: No rash noted. No pallor.  Psychiatric: He has a normal mood and affect. His behavior is normal.  Nursing note and vitals reviewed.  ED Treatments / Results  DIAGNOSTIC STUDIES:  Oxygen Saturation is 96% on RA, normal by my interpretation.    COORDINATION OF CARE:  1:38 AM Discussed treatment plan with pt at bedside and pt agreed to plan.   Labs (all labs ordered are listed, but only abnormal results are displayed) Labs Reviewed  CBC WITH DIFFERENTIAL/PLATELET - Abnormal; Notable for the following:       Result Value   WBC 15.3 (*)    RBC 3.89 (*)    Hemoglobin 12.5 (*)    HCT 37.7 (*)    Neutro Abs 12.3 (*)    All other components within normal limits  COMPREHENSIVE METABOLIC PANEL - Abnormal; Notable for the following:    Glucose, Bld 138 (*)    Creatinine, Ser 1.35 (*)    Total Protein 6.3 (*)    ALT 16 (*)    GFR calc non Af Amer 55 (*)    All other components within normal limits  RAPID URINE DRUG SCREEN, HOSP PERFORMED  D-DIMER, QUANTITATIVE (NOT AT [facility])  HIV ANTIBODY (ROUTINE TESTING)  CBC  BASIC METABOLIC PANEL  I-STAT TROPONIN, ED  CBG MONITORING, ED    EKG  EKG Interpretation  Date/Time:  Saturday October 10 2016 02:25:05 EDT Ventricular Rate:  77 PR Interval:    QRS Duration: 102 QT Interval:  409 QTC Calculation: 463 R Axis:   80 Text Interpretation:  Sinus rhythm Ventricular premature complex No significant change since last tracing Confirmed  by Melene Plan [phone]) on 10/10/2016 2:41:09 AM       Radiology Dg Chest 2 View  Result Date: 10/10/2016 CLINICAL DATA:  Weakness and headache x1 day. EXAM: CHEST  2 VIEW COMPARISON:  01/27/2005 FINDINGS: Cardiomegaly with tortuous ectatic thoracic aorta. Minimal aortic atherosclerosis. No pneumonic consolidation, effusion or pneumothorax. No acute nor suspicious osseous abnormalities. Degenerative joint space narrowing spurring about both AC and glenohumeral joints. IMPRESSION: Stable cardiomegaly with tortuous ectatic thoracic aorta. Aortic atherosclerosis. No acute pulmonary disease. Electronically Signed   By: Tollie Eth M.D.   On: 10/10/2016 02:35   Ct Head Wo Contrast  Result Date: 10/10/2016 CLINICAL DATA:  Headache, dizziness and nausea. Syncopal episodes x1. EXAM: CT HEAD  WITHOUT CONTRAST TECHNIQUE: Contiguous axial images were obtained from the base of the skull through the vertex without intravenous contrast. COMPARISON:  12/30/2014 FINDINGS: Brain: No evidence of acute infarction, hemorrhage, hydrocephalus, extra-axial collection or mass lesion/mass effect. Vascular: No hyperdense vessel or unexpected calcification. Skull: Normal. Negative for fracture or focal lesion. Sinuses/Orbits: No acute finding. Other: A few calcifications are noted of the left parotid gland that may represent sialoliths. IMPRESSION: 1. No acute intracranial abnormality. 2. Nonobstructing sialoliths of the left parotid gland. Electronically Signed   By: Tollie Ethavid  Kwon M.D.   On: 10/10/2016 02:27    Procedures Procedures (including critical care time)  Medications Ordered in ED Medications  sodium chloride flush (NS) 0.9 % injection 3 mL (not administered)  enoxaparin (LOVENOX) injection 40 mg (not administered)  0.9 %  sodium chloride infusion ( Intravenous New Bag/Given 10/10/16 0442)  acetaminophen (TYLENOL) tablet 650 mg (not administered)    Or  acetaminophen (TYLENOL) suppository 650 mg (not administered)    ondansetron (ZOFRAN) tablet 4 mg (not administered)    Or  ondansetron (ZOFRAN) injection 4 mg (not administered)  ondansetron (ZOFRAN) injection 4 mg (4 mg Intravenous Given 10/10/16 0103)  ondansetron (ZOFRAN) injection 4 mg (4 mg Intravenous Given 10/10/16 0145)  ondansetron (ZOFRAN) injection 4 mg (4 mg Intravenous Given 10/10/16 0243)     Initial Impression / Assessment and Plan / ED Course  I have reviewed the triage vital signs and the nursing notes.  Pertinent labs & imaging results that were available during my care of the patient were reviewed by me and considered in my medical decision making (see chart for details).  Clinical Course as of Oct 10 512  Sat Oct 10, 2016  0213 Spoke with Dr. Otelia LimesLindzen the neurologist who recommended EEG but wasn't sure that pt needed a neurologist consult at this time  [EH]    Clinical Course User Index [EH] Levon HedgerHall, Elizabeth    61 yo M With a chief complaint of syncope. Patient has had 3 of these events today. He did have some myoclonic jerking urinary incontinence and is somewhat sleepy on my exam. For this reason I discussed the case with neurology. I recommended an EEG. He also had a atypical presentation with syncope at rest and then multiple episodes. I discussed with hospitalist for admission.  The patients results and plan were reviewed and discussed.   Any x-rays performed were independently reviewed by myself.   Differential diagnosis were considered with the presenting HPI.  Medications  sodium chloride flush (NS) 0.9 % injection 3 mL (not administered)  enoxaparin (LOVENOX) injection 40 mg (not administered)  0.9 %  sodium chloride infusion ( Intravenous New Bag/Given 10/10/16 0442)  acetaminophen (TYLENOL) tablet 650 mg (not administered)    Or  acetaminophen (TYLENOL) suppository 650 mg (not administered)  ondansetron (ZOFRAN) tablet 4 mg (not administered)    Or  ondansetron (ZOFRAN) injection 4 mg (not administered)   ondansetron (ZOFRAN) injection 4 mg (4 mg Intravenous Given 10/10/16 0103)  ondansetron (ZOFRAN) injection 4 mg (4 mg Intravenous Given 10/10/16 0145)  ondansetron (ZOFRAN) injection 4 mg (4 mg Intravenous Given 10/10/16 0243)    Vitals:   10/10/16 0335 10/10/16 0336 10/10/16 0339 10/10/16 0420  BP: 105/66 112/74 114/76 (!) 104/55  Pulse: 70 74 77 71  Resp: 16   20  Temp:    98.2 F (36.8 C)  TempSrc:    Oral  SpO2: 97%   98%  Weight:    100.3 kg (  221 lb 3.2 oz)  Height:    5' 9.5" (1.765 m)    Final diagnoses:  Syncope and collapse    Admission/ observation were discussed with the admitting physician, patient and/or family and they are comfortable with the plan.    Final Clinical Impressions(s) / ED Diagnoses   Final diagnoses:  Syncope and collapse    New Prescriptions Current Discharge Medication List     I personally performed the services described in this documentation, which was scribed in my presence. The recorded information has been reviewed and is accurate.      Melene Plan, DO 10/10/16 [phone]

## 2016-10-10 NOTE — Progress Notes (Signed)
  Echocardiogram 2D Echocardiogram has been performed.  Elijah Wagner 10/10/2016, 1:18 PM

## 2016-10-10 NOTE — H&P (Signed)
History and Physical  Patient Name: Elijah Wagner     WUJ:[id number]    DOB: 11/23/1955    DOA: 10/10/2016 PCP: Armando Gang, FNP  Patient coming from: Home  Chief Complaint: Syncope      HPI: Elijah Wagner is a 61 y.o. male with a past medical history significant for HTN who presents with syncope.  The patient was in his usual state of health today, worked, felt well, this evening had some oysters for dinner, and after eating last oyster began to feel nauseated, lightheaded, profusely sweaty, and then slowly began to feel "like I was gonna pass out", then passed out.  His wife witnessed him to have a few seconds jerking and he micturated himself.  He woke within about 30 seconds and appeared "very pale" so she called 9-1-1.  He passed out a second time before EMS arrived, and then a third time with EMS, he felt nauseated, passed out, and then was awake again, and tranpsorted to the ER.  He had no chest pain, palpitations, dyspnea.    ED course: -Afebrile, heart rate 67, respirations normal, blood pressure 97/69, pulse ox 96% on room air -Na 138, K 3.8, Cr 1.35 (baseline 1.2), WBC 15.3 K, Hgb 12 -Troponin negative -LFTs normal -CT head unremarkable -Chest x-ray clear -ECG showed T-wave flattening diffusely, normal QRS and normal axis -Orthostatics were not done -The case was discussed with neurology, suspected syncope, but recommended EEG in the morning and TRH were asked to keep overnight for seizure versus vasovagal episode    He has no previous history of cardiac disease. He has no previous history of syncope or seizure. He does not use alcohol or illegal drugs. He has no family history of sudden cardiac death, unexplained death, or arrhythmia.     ROS: Review of Systems  Constitutional: Negative for chills, fever and malaise/fatigue.  Respiratory: Negative for shortness of breath.   Cardiovascular: Negative for chest pain.  Gastrointestinal: Positive for abdominal pain  (very mild, right sided) and nausea.  Neurological: Positive for loss of consciousness. Negative for dizziness, tingling, tremors, sensory change, speech change, focal weakness, seizures and headaches.          Past Medical History:  Diagnosis Date  . Hypertension     Past Surgical History:  Procedure Laterality Date  . HYDROCELE EXCISION / REPAIR    . TONSILLECTOMY      Social History: Patient lives with his girlfriend.  The patient walks unassisted.  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  Former smoker.  No alcohol or illegal drugs.  From [place].  No Known Allergies  Family history: family history includes Diabetes in his mother; Leukemia in his father; Sarcoidosis in his mother.  Prior to Admission medications   Medication Sig Start Date End Date Taking? Authorizing Provider  cyclobenzaprine (FLEXERIL) 5 MG tablet Take 1 tablet (5 mg total) by mouth every 8 (eight) hours as needed for muscle spasms. Patient not taking: Reported on 07/16/2016 12/30/14   Menshew, Charlesetta Ivory, [name]  lisinopril (PRINIVIL,ZESTRIL) 10 MG tablet Take 10 mg by mouth daily.    [provider]  naproxen (EC NAPROSYN) 500 MG EC tablet Take 1 tablet (500 mg total) by mouth 2 (two) times daily with a meal. Patient not taking: Reported on 07/16/2016 12/30/14   Menshew, Charlesetta Ivory, [name]       Physical Exam: BP 114/76 (BP Location: Right Arm)   Pulse  77   Temp (!) 97.5 F (36.4 C) (Oral)   Resp 16   SpO2 97%  General appearance: Well-developed, adult male, alert and in no acute distress, appears tired.   Eyes: Anicteric, conjunctiva pink, lids and lashes normal. PERRL.    ENT: No nasal deformity, discharge, epistaxis.  Hearing Normal. OP moist without lesions.   Neck: No neck masses.  Trachea midline.  No thyromegaly/tenderness. Lymph: No cervical or supraclavicular lymphadenopathy. Skin: Warm and dry.  No suspicious  rashes or lesions. Cardiac: RRR, nl S1-S2, no murmurs appreciated.  Capillary refill is brisk.  JVP normal.  No LE edema.  Radial and DP pulses 2+ and symmetric. Respiratory: Normal respiratory rate and rhythm.  CTAB without rales or wheezes. Abdomen: Abdomen soft.  Minimal Right TTP without guarding or rebound or rigidity. No ascites, distension, hepatosplenomegaly.   MSK: No deformities or effusions.  No cyanosis or clubbing. Neuro: Cranial nerves normal.  Sensation intact to light touch. Speech is fluent.  Muscle strength 5/5 and equal.    Psych: Sensorium intact and responding to questions, attention normal.  Behavior appropriate.  Affect normal.  Judgment and insight appear normal.     Labs on Admission:  I have personally reviewed following labs and imaging studies: CBC:  Recent Labs Lab 10/10/16 0100  WBC 15.3*  NEUTROABS 12.3*  HGB 12.5*  HCT 37.7*  MCV 96.9  PLT 239   Basic Metabolic Panel:  Recent Labs Lab 10/10/16 0100  NA 138  K 3.8  CL 106  CO2 27  GLUCOSE 138*  BUN 16  CREATININE 1.35*  CALCIUM 9.1   GFR: CrCl cannot be calculated (Unknown ideal weight.).  Liver Function Tests:  Recent Labs Lab 10/10/16 0100  AST 22  ALT 16*  ALKPHOS 67  BILITOT 0.7  PROT 6.3*  ALBUMIN 3.8   CBG:  Recent Labs Lab 10/10/16 0224  GLUCAP 98        Radiological Exams on Admission: Personally reviewed CXR clear: Dg Chest 2 View  Result Date: 10/10/2016 CLINICAL DATA:  Weakness and headache x1 day. EXAM: CHEST  2 VIEW COMPARISON:  01/27/2005 FINDINGS: Cardiomegaly with tortuous ectatic thoracic aorta. Minimal aortic atherosclerosis. No pneumonic consolidation, effusion or pneumothorax. No acute nor suspicious osseous abnormalities. Degenerative joint space narrowing spurring about both AC and glenohumeral joints. IMPRESSION: Stable cardiomegaly with tortuous ectatic thoracic aorta. Aortic atherosclerosis. No acute pulmonary disease. Electronically Signed   By:  Tollie Ethavid  Kwon M.D.   On: 10/10/2016 02:35   Ct Head Wo Contrast  Result Date: 10/10/2016 CLINICAL DATA:  Headache, dizziness and nausea. Syncopal episodes x1. EXAM: CT HEAD WITHOUT CONTRAST TECHNIQUE: Contiguous axial images were obtained from the base of the skull through the vertex without intravenous contrast. COMPARISON:  12/30/2014 FINDINGS: Brain: No evidence of acute infarction, hemorrhage, hydrocephalus, extra-axial collection or mass lesion/mass effect. Vascular: No hyperdense vessel or unexpected calcification. Skull: Normal. Negative for fracture or focal lesion. Sinuses/Orbits: No acute finding. Other: A few calcifications are noted of the left parotid gland that may represent sialoliths. IMPRESSION: 1. No acute intracranial abnormality. 2. Nonobstructing sialoliths of the left parotid gland. Electronically Signed   By: Tollie Ethavid  Kwon M.D.   On: 10/10/2016 02:27    EKG: Independently reviewed. Rate 77, QTc 463, normal axis and QRS interval, diffuse TW flattening.         Assessment/Plan  1. Syncope:  [place] syncope risk sore 0, low risk. -Monitor on telemetry -Check orthostatics -IV fluids overnight -Check UDS -Check d-dimer   -  EEG per Neurology   2. Hypertension:  Somewhat hypotensive on arrival. -Hold lisinopril for now  3. Leukocytosis:  No symptoms to suggest infection.  Suspect demargination, unclear.        DVT prophylaxis: Lovenox  Code Status: FULL  Family Communication: Girlfriend at bedside  Disposition Plan: Anticipate orthostatics, Neurology evaluation, IV fluids, check d-dimer.  If all testing negative, home tomorrow. Consults called: Neurology, Dr. Otelia Limes will see. Admission status: OBS At the point of initial evaluation, it is my clinical opinion that admission for OBSERVATION is reasonable and necessary because the patient's presenting complaints in the context of their chronic conditions represent sufficient risk of deterioration or  significant morbidity to constitute reasonable grounds for close observation in the hospital setting, but that the patient may be medically stable for discharge from the hospital within 24 to 48 hours.    Medical decision making: Patient seen at 3:30 AM on 10/10/2016.  The patient was discussed with Dr. Adela Lank.  What exists of the patient's chart was reviewed in depth and summarized above.  Clinical condition: stable.        Alberteen Sam [facility] Pager [phone]

## 2016-10-10 NOTE — Progress Notes (Signed)
EEG completed, results pending. 

## 2016-10-10 NOTE — ED Notes (Signed)
Pt returned from CT °

## 2016-10-10 NOTE — ED Notes (Signed)
Sent label to main lab for d-dimer

## 2016-10-10 NOTE — ED Triage Notes (Signed)
Patient arrived with EMS from home reports 3 episodes of seizures with urinary incontinence this evening prior to arrival , CBG= 192 , received 500 ml. bolus NS by EMS for BP= 66/30 . Alert and oriented at arrival / respirations unlabored.

## 2016-10-10 NOTE — ED Notes (Signed)
Patient transported to CT scan . 

## 2016-10-10 NOTE — Progress Notes (Signed)
[facility]                                                                              Patient Demographics  Elijah Wagner, is a 61 y.o. male, DOB - 1955/09/16, RUE:[id number]  Admit date - 10/10/2016   Admitting Physician Alberteen Samhristopher P Danford, MD  Outpatient Primary MD for the patient is Armando GangLindley, Cheryl P, FNP  Outpatient specialists:   LOS - 0  days   Medical records reviewed and are as summarized below:    Chief Complaint  Patient presents with  . Seizures  . Hypotension       Brief summary   Patient is a 61 year old male with hypertension presented with a syncopal episode. Patient reported that he was having fried oysters from [employer] for dinner and after eating the last was started he felt nauseated, lightheaded, profusely sweaty and passed out. He had 3 syncopal episodes, and was brought to ED by EMS. In ED, BP was 97/69. WBC 15.3. Troponin negative, CT head negative, EKG normal. Per wife, patient had a few seconds of jerking during the first episode and he micturated himself.    Assessment & Plan    Principal Problem:   Syncope - Likely a vasovagal episode, orthostatic positive on admission. CT head negative - Continue IV fluid hydration, 2-D echo and EEG have been ordered and is still pending - Hold lisinopril - No arrhythmias on telemetry, troponin negative, d-dimer less than 0.27, EKG normal  Active Problems:   Essential hypertension - Currently borderline hypotensive, hold lisinopril    Leukocytosis -Possibly stress-induced margination, no infectious etiology, no abdominal pain nausea vomiting to suggest any colitis. - Chest x-ray clear, no pneumonia. Will check pro-calcitonin   Code Status:Full CODE STATUS DVT Prophylaxis:  Lovenox Family Communication: Discussed in detail with the patient, all imaging results, lab results explained to the patient and wife   Disposition Plan: When workup complete   Time Spent in  minutes  25 minutes  Procedures:    Consultants:   neurology was consulted in ED   Antimicrobials:      Medications  Scheduled Meds: . enoxaparin (LOVENOX) injection  40 mg Subcutaneous Q24H  . sodium chloride flush  3 mL Intravenous Q12H   Continuous Infusions: . sodium chloride 125 mL/hr at 10/10/16 0442   PRN Meds:.acetaminophen **OR** acetaminophen, ondansetron **OR** ondansetron (ZOFRAN) IV   Antibiotics   Anti-infectives    None        Subjective:   Elijah Wagner was seen and examined today. No complaints today, feels better this morning, no chest pain or shortness of breath. No dizziness. No seizure-like activity. Patient denies abdominal pain, N/V/D/C, new weakness, numbess, tingling. No acute events overnight.    Objective:   Vitals:   10/10/16 0336 10/10/16 0339 10/10/16 0420 10/10/16 1016  BP: 112/74 114/76 (!) 104/55 118/61  Pulse: 74 77 71 79  Resp:   20 18  Temp:   98.2 F (36.8 C) 98.6 F (37 C)  TempSrc:   Oral Oral  SpO2:   98% 97%  Weight:   100.3 kg (221 lb 3.2 oz)   Height:  5' 9.5" (1.765 m)     Intake/Output Summary (Last 24 hours) at 10/10/16 1153 Last data filed at 10/10/16 0600  Gross per 24 hour  Intake            162.5 ml  Output                0 ml  Net            162.5 ml     Wt Readings from Last 3 Encounters:  10/10/16 100.3 kg (221 lb 3.2 oz)  07/16/16 105.5 kg (232 lb 8 oz)  05/21/15 105.6 kg (232 lb 12.8 oz)     Exam  General: Alert and oriented x 3, NAD  Eyes: PERRLA, EOMI, Anicteric Sclera,  HEENT:  Atraumatic, normocephalic, normal oropharynx  Cardiovascular: S1 S2 auscultated, no rubs, murmurs or gallops. Regular rate and rhythm.  Respiratory: Clear to auscultation bilaterally, no wheezing, rales or rhonchi  Gastrointestinal: Soft, nontender, nondistended, + bowel sounds  Ext: no pedal edema bilaterally  Neuro: AAOx3, Cr N's II- XII. Strength 5/5 upper and lower extremities bilaterally,  speech clear, sensations grossly intact  Musculoskeletal: No digital cyanosis, clubbing  Skin: No rashes  Psych: Normal affect and demeanor, alert and oriented x3    Data Reviewed:  I have personally reviewed following labs and imaging studies  Micro Results No results found for this or any previous visit (from the past 240 hour(s)).  Radiology Reports Dg Chest 2 View  Result Date: 10/10/2016 CLINICAL DATA:  Weakness and headache x1 day. EXAM: CHEST  2 VIEW COMPARISON:  01/27/2005 FINDINGS: Cardiomegaly with tortuous ectatic thoracic aorta. Minimal aortic atherosclerosis. No pneumonic consolidation, effusion or pneumothorax. No acute nor suspicious osseous abnormalities. Degenerative joint space narrowing spurring about both AC and glenohumeral joints. IMPRESSION: Stable cardiomegaly with tortuous ectatic thoracic aorta. Aortic atherosclerosis. No acute pulmonary disease. Electronically Signed   By: Tollie Eth M.D.   On: 10/10/2016 02:35   Ct Head Wo Contrast  Result Date: 10/10/2016 CLINICAL DATA:  Headache, dizziness and nausea. Syncopal episodes x1. EXAM: CT HEAD WITHOUT CONTRAST TECHNIQUE: Contiguous axial images were obtained from the base of the skull through the vertex without intravenous contrast. COMPARISON:  12/30/2014 FINDINGS: Brain: No evidence of acute infarction, hemorrhage, hydrocephalus, extra-axial collection or mass lesion/mass effect. Vascular: No hyperdense vessel or unexpected calcification. Skull: Normal. Negative for fracture or focal lesion. Sinuses/Orbits: No acute finding. Other: A few calcifications are noted of the left parotid gland that may represent sialoliths. IMPRESSION: 1. No acute intracranial abnormality. 2. Nonobstructing sialoliths of the left parotid gland. Electronically Signed   By: Tollie Eth M.D.   On: 10/10/2016 02:27    Lab Data:  CBC:  Recent Labs Lab 10/10/16 0100 10/10/16 0443  WBC 15.3* 15.2*  NEUTROABS 12.3*  --   HGB 12.5* 13.2    HCT 37.7* 37.6*  MCV 96.9 96.4  PLT 239 269   Basic Metabolic Panel:  Recent Labs Lab 10/10/16 0100 10/10/16 0443  NA 138 136  K 3.8 4.5  CL 106 103  CO2 27 25  GLUCOSE 138* 137*  BUN 16 16  CREATININE 1.35* 1.23  CALCIUM 9.1 9.1   GFR: Estimated Creatinine Clearance: 73.4 mL/min (by C-G formula based on SCr of 1.23 mg/dL). Liver Function Tests:  Recent Labs Lab 10/10/16 0100  AST 22  ALT 16*  ALKPHOS 67  BILITOT 0.7  PROT 6.3*  ALBUMIN 3.8   No results for input(s): LIPASE, AMYLASE in the  last 168 hours. No results for input(s): AMMONIA in the last 168 hours. Coagulation Profile: No results for input(s): INR, PROTIME in the last 168 hours. Cardiac Enzymes: No results for input(s): CKTOTAL, CKMB, CKMBINDEX, TROPONINI in the last 168 hours. BNP (last 3 results) No results for input(s): PROBNP in the last 8760 hours. HbA1C: No results for input(s): HGBA1C in the last 72 hours. CBG:  Recent Labs Lab 10/10/16 0224 10/10/16 0634  GLUCAP 98 166*   Lipid Profile: No results for input(s): CHOL, HDL, LDLCALC, TRIG, CHOLHDL, LDLDIRECT in the last 72 hours. Thyroid Function Tests: No results for input(s): TSH, T4TOTAL, FREET4, T3FREE, THYROIDAB in the last 72 hours. Anemia Panel: No results for input(s): VITAMINB12, FOLATE, FERRITIN, TIBC, IRON, RETICCTPCT in the last 72 hours. Urine analysis:    Component Value Date/Time   APPEARANCEUR Clear 05/21/2015 1037   GLUCOSEU Negative 05/21/2015 1037   BILIRUBINUR Negative 05/21/2015 1037   PROTEINUR Negative 05/21/2015 1037   NITRITE Negative 05/21/2015 1037   LEUKOCYTESUR Negative 05/21/2015 1037     Ripudeep Rai M.D. [facility] 10/10/2016, 11:53 AM  Pager: [phone] Between 7am to 7pm - call Pager - [phone]  After 7pm go to [email or web address] - password [email or web address]  Call night coverage person covering after 7pm

## 2016-10-10 NOTE — Procedures (Signed)
History: 61 yo M being evaluated for syncope  Sedation: none  Technique: This is a 21 channel routine scalp EEG performed at the bedside with bipolar and monopolar montages arranged in accordance to the international 10/20 system of electrode placement. One channel was dedicated to EKG recording.    Background: The background consists of intermixed alpha and beta activities. There is a well defined posterior dominant rhythm of 10 Hz that attenuates with eye opening. Sleep is recorded with normal appearing structures.   Photic stimulation: Physiologic driving is not performed   EEG Abnormalities: none  Clinical Interpretation: This normal EEG is recorded in the waking and sleep state. There was no seizure or seizure predisposition recorded on this study. Please note that a normal EEG does not preclude the possibility of epilepsy.   Ritta SlotMcNeill Mahamud Metts, MD [facility] [phone]  If 7pm- 7am, please page neurology on call as listed in AMION.

## 2016-10-14 LAB — HIV ANTIBODY (ROUTINE TESTING W REFLEX): HIV Screen 4th Generation wRfx: NONREACTIVE

## 2017-07-12 ENCOUNTER — Other Ambulatory Visit: Payer: BLUE CROSS/BLUE SHIELD

## 2017-07-15 ENCOUNTER — Ambulatory Visit: Payer: BLUE CROSS/BLUE SHIELD | Admitting: Urology

## 2017-07-26 ENCOUNTER — Other Ambulatory Visit: Payer: BLUE CROSS/BLUE SHIELD

## 2017-07-28 ENCOUNTER — Ambulatory Visit: Payer: BLUE CROSS/BLUE SHIELD | Admitting: Urology

## 2017-08-24 ENCOUNTER — Other Ambulatory Visit: Payer: Self-pay | Admitting: Family Medicine

## 2017-08-24 DIAGNOSIS — N138 Other obstructive and reflux uropathy: Secondary | ICD-10-CM

## 2017-08-24 DIAGNOSIS — N401 Enlarged prostate with lower urinary tract symptoms: Principal | ICD-10-CM

## 2017-08-26 ENCOUNTER — Other Ambulatory Visit: Payer: BLUE CROSS/BLUE SHIELD

## 2017-08-26 DIAGNOSIS — N401 Enlarged prostate with lower urinary tract symptoms: Principal | ICD-10-CM

## 2017-08-26 DIAGNOSIS — N138 Other obstructive and reflux uropathy: Secondary | ICD-10-CM

## 2017-08-27 LAB — PSA: Prostate Specific Ag, Serum: 0.9 ng/mL (ref 0.0–4.0)

## 2017-08-30 NOTE — Progress Notes (Signed)
08/31/2017 9:52 AM   Elijah Wagner [birth year] [id number]  Referring provider: Armando GangLindley, Cheryl P, FNP [address] [place], [place] [address]  Chief Complaint  Patient presents with  . Follow-up    HPI: 62 yo AAM with BPH and LU TS and ED who presents today for a one year follow up.  BPH WITH LUTS His previous IPSS score was 1/3.  He has no urinary complaints today.  Patient denies any gross hematuria, dysuria or suprapubic/flank pain.  Patient denies any fevers, chills, nausea or vomiting.  He drinks a gallon and a half of water daily.     Erectile dysfunction He has a 25 year old girlfriend and he feels that his libido has reduced and he cannot keep up with her.  His previous SHIM score was 20.   which is mild ED.   He has been having difficulty with erections for a while.   His major complaint is inconsitency with erections.  His risk factors for ED are age, BPH and HTN.  He denies any painful erections or curvatures with his erections.   He is still having having spontaneous erections.  He has tried Viagra in the past, but the results are equivocal.      PMH: Past Medical History:  Diagnosis Date  . Hypertension     Surgical History: Past Surgical History:  Procedure Laterality Date  . HYDROCELE EXCISION / REPAIR    . TONSILLECTOMY      Home Medications:  Allergies as of 08/31/2017   No Known Allergies     Medication List        Accurate as of 08/31/17  9:52 AM. Always use your most recent med list.          aspirin 81 MG EC tablet Take by mouth.   chlorhexidine 0.12 % solution Commonly known as:  PERIDEX   clindamycin 1 % lotion Commonly known as:  CLEOCIN T APPLY TOPICALLY TO THE AFFECTED AREA EVERY DAY   doxycycline 20 MG tablet Commonly known as:  PERIOSTAT   lisinopril 10 MG tablet Commonly known as:  PRINIVIL,ZESTRIL Take 1 tablet (10 mg total) by mouth daily. Please HOLD for next 3 days   sildenafil 20 MG tablet Commonly known as:   REVATIO Take 3 to 5 tablets two hours before intercouse on an empty stomach.  Do not take with nitrates.       Allergies: No Known Allergies  Family History: Family History  Problem Relation Age of Onset  . Leukemia Father   . Sarcoidosis Mother   . Diabetes Mother   . Bladder Cancer Neg Hx   . Prostate cancer Neg Hx   . Kidney cancer Neg Hx   . Sudden Cardiac Death Neg Hx   . Arrhythmia Neg Hx   . Heart attack Neg Hx     Social History:  reports that he quit smoking about 4 years ago. He has never used smokeless tobacco. He reports that he does not drink alcohol or use drugs.  ROS: UROLOGY Frequent Urination?: No Hard to postpone urination?: No Burning/pain with urination?: No Get up at night to urinate?: No Leakage of urine?: No Urine stream starts and stops?: No Trouble starting stream?: No Do you have to strain to urinate?: No Blood in urine?: No Urinary tract infection?: No Sexually transmitted disease?: No Injury to kidneys or bladder?: No Painful intercourse?: No Weak stream?: No Erection problems?: No Penile pain?: No  Gastrointestinal Nausea?: No Vomiting?: No Indigestion/heartburn?: No  Diarrhea?: No Constipation?: No  Constitutional Fever: No Night sweats?: No Weight loss?: No Fatigue?: No  Skin Skin rash/lesions?: No Itching?: No  Eyes Blurred vision?: No Double vision?: No  Ears/Nose/Throat Sore throat?: No Sinus problems?: No  Hematologic/Lymphatic Swollen glands?: No Easy bruising?: No  Cardiovascular Leg swelling?: No Chest pain?: No  Respiratory Cough?: No Shortness of breath?: No  Endocrine Excessive thirst?: No  Musculoskeletal Back pain?: No Joint pain?: No  Neurological Headaches?: No Dizziness?: No  Psychologic Depression?: No Anxiety?: No  Physical Exam: BP (!) 145/81 (BP Location: Right Arm, Patient Position: Sitting, Cuff Size: Large)   Pulse 74   Ht 5\' 10"  (1.778 m)   Wt 220 lb 8 oz (100 kg)    BMI 31.64 kg/m   Constitutional: Well nourished. Alert and oriented, No acute distress. HEENT:  AT, moist mucus membranes. Trachea midline, no masses. Cardiovascular: No clubbing, cyanosis, or edema. Respiratory: Normal respiratory effort, no increased work of breathing. GI: Abdomen is soft, non tender, non distended, no abdominal masses. Liver and spleen not palpable.  No hernias appreciated.  Stool sample for occult testing is not indicated.   GU: No CVA tenderness.  No bladder fullness or masses.  Patient with circumcised phallus.  Urethral meatus is patent.  No penile discharge. No penile lesions or rashes. Scrotum without lesions, cysts, rashes and/or edema.  Testicles are located scrotally bilaterally. No masses are appreciated in the testicles. Left and right epididymis are normal. Rectal: Patient with  normal sphincter tone. Anus and perineum without scarring or rashes. No rectal masses are appreciated. Prostate is approximately 45 grams, deep medium sulcus,  no nodules are appreciated. Seminal vesicles are normal. Skin: No rashes, bruises or suspicious lesions. Lymph: No cervical or inguinal adenopathy. Neurologic: Grossly intact, no focal deficits, moving all 4 extremities. Psychiatric: Normal mood and affect.   Laboratory Data: PSA History  0.9 ng/mL on 05/21/2015  0.9 ng/mL on 08/26/2017   Lab Results  Component Value Date   WBC 15.2 (H) 10/10/2016   HGB 13.2 10/10/2016   HCT 37.6 (L) 10/10/2016   MCV 96.4 10/10/2016   PLT 269 10/10/2016    Lab Results  Component Value Date   CREATININE 1.23 10/10/2016   I have reviewed the labs.  Assessment & Plan:    1. BPH with LU TS Continue conservative management, avoiding bladder irritants and timed voiding's  - most bothersome symptoms is/are frequency  - patient not interested in medications at this time  - RTC in 12 months for IPSS, PSA and exam    2. Erectile dysfunction We will obtain a serum testosterone  level at this time; if it is abnormal we will need to repeat the study for confirmation in the am Script given for Sildenafil 20 mg, 3 to 5 tablets two hours prior to intercourse on an empty stomach, # 50; he is warned not to take medications that contain nitrates.  I also advised him of the side effects, such as: headache, flushing, dyspepsia, abnormal vision, nasal congestion, back pain, myalgia, nausea, dizziness, and rash.  Return in about 1 year (around 09/01/2018) for IPSS, SHIM, PSA and exam.  Michiel Cowboy, [facility]  [facility] [address] [place], [place] [address] [phone]

## 2017-08-31 ENCOUNTER — Encounter: Payer: Self-pay | Admitting: Urology

## 2017-08-31 ENCOUNTER — Ambulatory Visit (INDEPENDENT_AMBULATORY_CARE_PROVIDER_SITE_OTHER): Payer: BLUE CROSS/BLUE SHIELD | Admitting: Urology

## 2017-08-31 VITALS — BP 145/81 | HR 74 | Ht 70.0 in | Wt 220.5 lb

## 2017-08-31 DIAGNOSIS — N401 Enlarged prostate with lower urinary tract symptoms: Secondary | ICD-10-CM | POA: Diagnosis not present

## 2017-08-31 DIAGNOSIS — N138 Other obstructive and reflux uropathy: Secondary | ICD-10-CM | POA: Diagnosis not present

## 2017-08-31 DIAGNOSIS — N529 Male erectile dysfunction, unspecified: Secondary | ICD-10-CM

## 2017-08-31 MED ORDER — SILDENAFIL CITRATE 20 MG PO TABS: ORAL_TABLET | ORAL | 3 refills | Status: DC

## 2017-09-01 ENCOUNTER — Encounter: Payer: Self-pay | Admitting: Family Medicine

## 2017-09-01 ENCOUNTER — Telehealth: Payer: Self-pay | Admitting: Family Medicine

## 2017-09-01 LAB — TESTOSTERONE: Testosterone: 301 ng/dL (ref 264–916)

## 2017-09-01 NOTE — Telephone Encounter (Signed)
Letter sent.

## 2017-09-01 NOTE — Telephone Encounter (Signed)
-----   Message from Harle BattiestShannon A McGowan, [name] sent at 09/01/2017  9:47 AM EDT ----- Please let Mr. Alferd PateeRichmond know that his testosterone was normal.

## 2018-08-23 ENCOUNTER — Other Ambulatory Visit: Payer: Self-pay | Admitting: Family Medicine

## 2018-08-23 DIAGNOSIS — N401 Enlarged prostate with lower urinary tract symptoms: Secondary | ICD-10-CM

## 2018-08-23 DIAGNOSIS — N138 Other obstructive and reflux uropathy: Secondary | ICD-10-CM

## 2018-08-24 ENCOUNTER — Other Ambulatory Visit: Payer: BLUE CROSS/BLUE SHIELD

## 2018-08-24 ENCOUNTER — Encounter: Payer: Self-pay | Admitting: Urology

## 2018-08-30 NOTE — Progress Notes (Deleted)
08/31/2018 2:21 PM   Elijah ArenasJames Matsuura 09-05-1955 [id number]  Referring provider: Armando GangLindley, Cheryl P, FNP [address] [place], [place] [address]  No chief complaint on file.   HPI: 63 yo AAM with BPH and LU TS and ED who presents today for a one year follow up.  BPH WITH LUTS  (prostate and/or bladder) IPSS score: *** PVR: ***   Previous score: 1/3   Previous PVR: ***   Major complaint(s):  x *** years. Denies any dysuria, hematuria or suprapubic pain.   Currently taking: ***.  His has had ***.   Denies any recent fevers, chills, nausea or vomiting.  He has a family history of PCa, with ***.   He does not have a family history of PCa.***    Score:  1-7 Mild 8-19 Moderate 20-35 Severe   BPH WITH LUTS His previous IPSS score was 1/3.  He has no urinary complaints today.  Patient denies any gross hematuria, dysuria or suprapubic/flank pain.  Patient denies any fevers, chills, nausea or vomiting.  He drinks a gallon and a half of water daily.     Erectile dysfunction He has a 534 year old girlfriend and he feels that his libido has reduced and he cannot keep up with her.  SHIM score is ***.   His previous SHIM score was 20.   which is mild ED.   He has been having difficulty with erections for a while.   His major complaint is inconsitency with erections.  His risk factors for ED are age, BPH and HTN.  He denies any painful erections or curvatures with his erections.   He is still having having spontaneous erections.  He has tried Viagra in the past, but the results are equivocal.      PMH: Past Medical History:  Diagnosis Date   Hypertension     Surgical History: Past Surgical History:  Procedure Laterality Date   HYDROCELE EXCISION / REPAIR     TONSILLECTOMY      Home Medications:  Allergies as of 08/31/2018   No Known Allergies     Medication List       Accurate as of August 30, 2018  2:21 PM. If you have any questions, ask your nurse or doctor.          aspirin 81 MG EC tablet Take by mouth.   chlorhexidine 0.12 % solution Commonly known as:  PERIDEX   clindamycin 1 % lotion Commonly known as:  CLEOCIN T APPLY TOPICALLY TO THE AFFECTED AREA EVERY DAY   doxycycline 20 MG tablet Commonly known as:  PERIOSTAT   lisinopril 10 MG tablet Commonly known as:  ZESTRIL Take 1 tablet (10 mg total) by mouth daily. Please HOLD for next 3 days   sildenafil 20 MG tablet Commonly known as:  REVATIO Take 3 to 5 tablets two hours before intercouse on an empty stomach.  Do not take with nitrates.       Allergies: No Known Allergies  Family History: Family History  Problem Relation Age of Onset   Leukemia Father    Sarcoidosis Mother    Diabetes Mother    Bladder Cancer Neg Hx    Prostate cancer Neg Hx    Kidney cancer Neg Hx    Sudden Cardiac Death Neg Hx    Arrhythmia Neg Hx    Heart attack Neg Hx     Social History:  reports that he quit smoking about 5 years ago. He has never used  smokeless tobacco. He reports that he does not drink alcohol or use drugs.  ROS:                                        Physical Exam: There were no vitals taken for this visit.  Constitutional:  Well nourished. Alert and oriented, No acute distress. HEENT: [place] AT, moist mucus membranes.  Trachea midline, no masses. Cardiovascular: No clubbing, cyanosis, or edema. Respiratory: Normal respiratory effort, no increased work of breathing. GI: Abdomen is soft, non tender, non distended, no abdominal masses. Liver and spleen not palpable.  No hernias appreciated.  Stool sample for occult testing is not indicated.   GU: No CVA tenderness.  No bladder fullness or masses.  Patient with circumcised/uncircumcised phallus. ***Foreskin easily retracted***  Urethral meatus is patent.  No penile discharge. No penile lesions or rashes. Scrotum without lesions, cysts, rashes and/or edema.  Testicles are located scrotally bilaterally.  No masses are appreciated in the testicles. Left and right epididymis are normal. Rectal: Patient with  normal sphincter tone. Anus and perineum without scarring or rashes. No rectal masses are appreciated. Prostate is approximately *** grams, *** nodules are appreciated. Seminal vesicles are normal. Skin: No rashes, bruises or suspicious lesions. Lymph: No cervical or inguinal adenopathy. Neurologic: Grossly intact, no focal deficits, moving all 4 extremities. Psychiatric: Normal mood and affect.   Laboratory Data: PSA History  0.9 ng/mL on 05/21/2015  0.9 ng/mL on 08/26/2017   Lab Results  Component Value Date   WBC 15.2 (H) 10/10/2016   HGB 13.2 10/10/2016   HCT 37.6 (L) 10/10/2016   MCV 96.4 10/10/2016   PLT 269 10/10/2016    Lab Results  Component Value Date   CREATININE 1.23 10/10/2016   I have reviewed the labs.  Assessment & Plan:    1. BPH with LU TS Continue conservative management, avoiding bladder irritants and timed voiding's  - most bothersome symptoms is/are frequency  - patient not interested in medications at this time  - RTC in 12 months for IPSS, PSA and exam    2. Erectile dysfunction We will obtain a serum testosterone level at this time; if it is abnormal we will need to repeat the study for confirmation in the am Script given for Sildenafil 20 mg, 3 to 5 tablets two hours prior to intercourse on an empty stomach, # 50; he is warned not to take medications that contain nitrates.  I also advised him of the side effects, such as: headache, flushing, dyspepsia, abnormal vision, nasal congestion, back pain, myalgia, nausea, dizziness, and rash.  No follow-ups on file.  Zara Council, [name]  [facility] [address] [place], [place] [address] [phone]

## 2018-08-31 ENCOUNTER — Ambulatory Visit: Payer: BC Managed Care – PPO | Admitting: Urology

## 2018-08-31 ENCOUNTER — Encounter: Payer: Self-pay | Admitting: Urology

## 2018-09-14 ENCOUNTER — Other Ambulatory Visit: Payer: Self-pay

## 2018-09-14 ENCOUNTER — Other Ambulatory Visit: Payer: BC Managed Care – PPO

## 2018-09-14 DIAGNOSIS — N138 Other obstructive and reflux uropathy: Secondary | ICD-10-CM

## 2018-09-15 LAB — PSA: Prostate Specific Ag, Serum: 0.9 ng/mL (ref 0.0–4.0)

## 2018-09-21 NOTE — Progress Notes (Signed)
09/22/2018 9:30 AM   Elijah Wagner Jul 24, 1955 [id number]  Referring provider: Remi Haggard, [place],  [place] [address]  Chief Complaint  Patient presents with  . Benign Prostatic Hypertrophy    HPI: 63 yo AAM with BPH and LU TS and ED who presents today for a one year follow up.  BPH WITH LUTS  (prostate and/or bladder) IPSS score: 2/0     PVR: 0 mL  Previous score: 1/3    Major complaint(s):  Nocturia x 1-2  x several years. Denies any dysuria, hematuria or suprapubic pain.   Denies any recent fevers, chills, nausea or vomiting.  He does not have a family history of PCa. IPSS    Row Name 09/22/18 0800         International Prostate Symptom Score   How often have you had the sensation of not emptying your bladder?  Not at All     How often have you had to urinate less than every two hours?  Not at All     How often have you found you stopped and started again several times when you urinated?  Less than 1 in 5 times     How often have you found it difficult to postpone urination?  Not at All     How often have you had a weak urinary stream?  Not at All     How often have you had to strain to start urination?  Not at All     How many times did you typically get up at night to urinate?  1 Time     Total IPSS Score  2       Quality of Life due to urinary symptoms   If you were to spend the rest of your life with your urinary condition just the way it is now how would you feel about that?  Delighted        Score:  1-7 Mild 8-19 Moderate 20-35 Severe   Erectile dysfunction He has a 68 year old girlfriend and he feels that his libido has reduced and he cannot keep up with her.  SHIM score is 21.   His previous SHIM score was 20.   which is mild ED.   He has been having difficulty with erections for a while.   His major complaint is inconsitency with erections.  His risk factors for ED are age, BPH and HTN.  He denies any painful erections or  curvatures with his erections.   He is still having having spontaneous erections.  He has tried Viagra in the past, but the results are equivocal.   He tried his brother's Cialis and found that more effective.   SHIM    Row Name 09/22/18 0904         SHIM: Over the last 6 months:   How do you rate your confidence that you could get and keep an erection?  Moderate     When you had erections with sexual stimulation, how often were your erections hard enough for penetration (entering your partner)?  Most Times (much more than half the time)     During sexual intercourse, how often were you able to maintain your erection after you had penetrated (entered) your partner?  Almost Always or Always     During sexual intercourse, how difficult was it to maintain your erection to completion of intercourse?  Slightly Difficult     When you attempted sexual intercourse,  how often was it satisfactory for you?  Almost Always or Always       SHIM Total Score   SHIM  21        Score: 1-7 Severe ED 8-11 Moderate ED 12-16 Mild-Moderate ED 17-21 Mild ED 22-25 No ED   PMH: Past Medical History:  Diagnosis Date  . Hypertension     Surgical History: Past Surgical History:  Procedure Laterality Date  . HYDROCELE EXCISION / REPAIR    . TONSILLECTOMY      Home Medications:  Allergies as of 09/22/2018   No Known Allergies     Medication List       Accurate as of September 22, 2018  9:30 AM. If you have any questions, ask your nurse or doctor.        STOP taking these medications   chlorhexidine 0.12 % solution Commonly known as: PERIDEX Stopped by: [name], [name]   clindamycin 1 % lotion Commonly known as: CLEOCIN T Stopped by: [name], [name]   doxycycline 20 MG tablet Commonly known as: PERIOSTAT Stopped by: [name], [name]     TAKE these medications   aspirin 81 MG EC tablet Take by mouth.   lisinopril 10 MG tablet Commonly known as: ZESTRIL Take 1 tablet (10  mg total) by mouth daily. Please HOLD for next 3 days   sildenafil 20 MG tablet Commonly known as: REVATIO Take 3 to 5 tablets two hours before intercouse on an empty stomach.  Do not take with nitrates.   tadalafil 20 MG tablet Commonly known as: CIALIS Take 1 tablet (20 mg total) by mouth daily as needed for erectile dysfunction. Started by: [name], [name]       Allergies: No Known Allergies  Family History: Family History  Problem Relation Age of Onset  . Leukemia Father   . Sarcoidosis Mother   . Diabetes Mother   . Bladder Cancer Neg Hx   . Prostate cancer Neg Hx   . Kidney cancer Neg Hx   . Sudden Cardiac Death Neg Hx   . Arrhythmia Neg Hx   . Heart attack Neg Hx     Social History:  reports that he quit smoking about 5 years ago. He has never used smokeless tobacco. He reports that he does not drink alcohol or use drugs.  ROS: UROLOGY Frequent Urination?: No Hard to postpone urination?: No Burning/pain with urination?: No Get up at night to urinate?: Yes Leakage of urine?: No Urine stream starts and stops?: No Trouble starting stream?: No Do you have to strain to urinate?: No Blood in urine?: No Urinary tract infection?: No Sexually transmitted disease?: No Injury to kidneys or bladder?: No Painful intercourse?: No Weak stream?: No Erection problems?: No Penile pain?: No  Gastrointestinal Nausea?: No Vomiting?: No Indigestion/heartburn?: No Diarrhea?: No Constipation?: No  Constitutional Fever: No Night sweats?: No Weight loss?: No Fatigue?: No  Skin Skin rash/lesions?: No Itching?: No  Eyes Blurred vision?: No Double vision?: No  Ears/Nose/Throat Sore throat?: No Sinus problems?: Yes  Hematologic/Lymphatic Swollen glands?: No Easy bruising?: No  Cardiovascular Leg swelling?: No Chest pain?: No  Respiratory Cough?: No Shortness of breath?: No  Endocrine Excessive thirst?: No  Musculoskeletal Back pain?: No  Joint pain?: No  Neurological Headaches?: No Dizziness?: No  Psychologic Depression?: No Anxiety?: No  Physical Exam: BP (!) 148/98 (BP Location: Left Arm, Patient Position: Sitting, Cuff Size: Normal)   Pulse 76   Ht 5\' 10"  (1.778 m)  Wt 234 lb (106.1 kg)   BMI 33.58 kg/m   Constitutional:  Well nourished. Alert and oriented, No acute distress. HEENT: [place] AT, moist mucus membranes.  Trachea midline, no masses. Cardiovascular: No clubbing, cyanosis, or edema. Respiratory: Normal respiratory effort, no increased work of breathing. GI: Abdomen is soft, non tender, non distended, no abdominal masses. Liver and spleen not palpable.  No hernias appreciated.  Stool sample for occult testing is not indicated.   GU: No CVA tenderness.  No bladder fullness or masses.  Patient with circumcised phallus.  Urethral meatus is patent.  No penile discharge. No penile lesions or rashes. Scrotum without lesions, cysts, rashes and/or edema.  Testicles are located scrotally bilaterally. No masses are appreciated in the testicles. Left and right epididymis are normal. Rectal: Patient with  normal sphincter tone. Anus and perineum without scarring or rashes. No rectal masses are appreciated. Prostate is approximately 35 grams, no nodules are appreciated. Seminal vesicles are normal. Skin: No rashes, bruises or suspicious lesions. Lymph: No inguinal adenopathy. Neurologic: Grossly intact, no focal deficits, moving all 4 extremities. Psychiatric: Normal mood and affect.   Laboratory Data: PSA History  0.9 ng/mL on 05/21/2015  0.9 ng/mL on 08/26/2017  0.9 in 08/2018   Lab Results  Component Value Date   WBC 15.2 (H) 10/10/2016   HGB 13.2 10/10/2016   HCT 37.6 (L) 10/10/2016   MCV 96.4 10/10/2016   PLT 269 10/10/2016    Lab Results  Component Value Date   CREATININE 1.23 10/10/2016   I have reviewed the labs.  Pertinent Imaging Results for Elijah Wagner, Elijah Wagner (MRN [id number]) as of 09/22/2018  11:29  Ref. Range 09/22/2018 09:40  Scan Result Unknown 0    Assessment & Plan:    1. BPH with LU TS I PSS score is 2/0, it is slightly worse Continue conservative management, avoiding bladder irritants and timed voiding's Most bothersome symptoms is/are nocturia  Patient not interested in medications at this time RTC in 12 months for IPSS, PSA and exam   2. Nocturia I have suggested that the patient avoid caffeine after noon and alcohol in the evening.  He may also benefit from fluid restrictions after 6:00 in the evening and voiding just prior to bedtime. He is trying to reduce sodium in diet He had a sleep study one year ago and it ruled out sleep apnea  3. Erectile dysfunction Script given for tadalafil (Cialis) 20 mg, one tablet 1-2 hours prior to intercourse, # 30 with 3 refills RTC in one year for SHIM and exam   Return in about 1 year (around 09/22/2019) for IPSS, SHIM, PSA and exam.  Michiel CowboySHANNON Howell Groesbeck, [facility]  [facility] [address] [place], [place] [phone]

## 2018-09-22 ENCOUNTER — Other Ambulatory Visit: Payer: Self-pay

## 2018-09-22 ENCOUNTER — Encounter: Payer: Self-pay | Admitting: Urology

## 2018-09-22 ENCOUNTER — Ambulatory Visit: Payer: Self-pay | Admitting: Urology

## 2018-09-22 VITALS — BP 148/98 | HR 76 | Ht 70.0 in | Wt 234.0 lb

## 2018-09-22 DIAGNOSIS — N401 Enlarged prostate with lower urinary tract symptoms: Secondary | ICD-10-CM

## 2018-09-22 DIAGNOSIS — N529 Male erectile dysfunction, unspecified: Secondary | ICD-10-CM

## 2018-09-22 DIAGNOSIS — R351 Nocturia: Secondary | ICD-10-CM

## 2018-09-22 DIAGNOSIS — N138 Other obstructive and reflux uropathy: Secondary | ICD-10-CM

## 2018-09-22 LAB — BLADDER SCAN AMB NON-IMAGING: Scan Result: 0

## 2018-09-22 MED ORDER — TADALAFIL 20 MG PO TABS: 20.0000 mg | ORAL_TABLET | Freq: Every day | ORAL | 6 refills | Status: DC | PRN

## 2018-12-03 IMAGING — CR DG CHEST 2V
2 series · 2 of 2 positions shown · non-contrast
Comparison: 01/27/2005

CLINICAL DATA: Weakness and headache x1 day.

EXAM:
CHEST  2 VIEW

[chest lat]
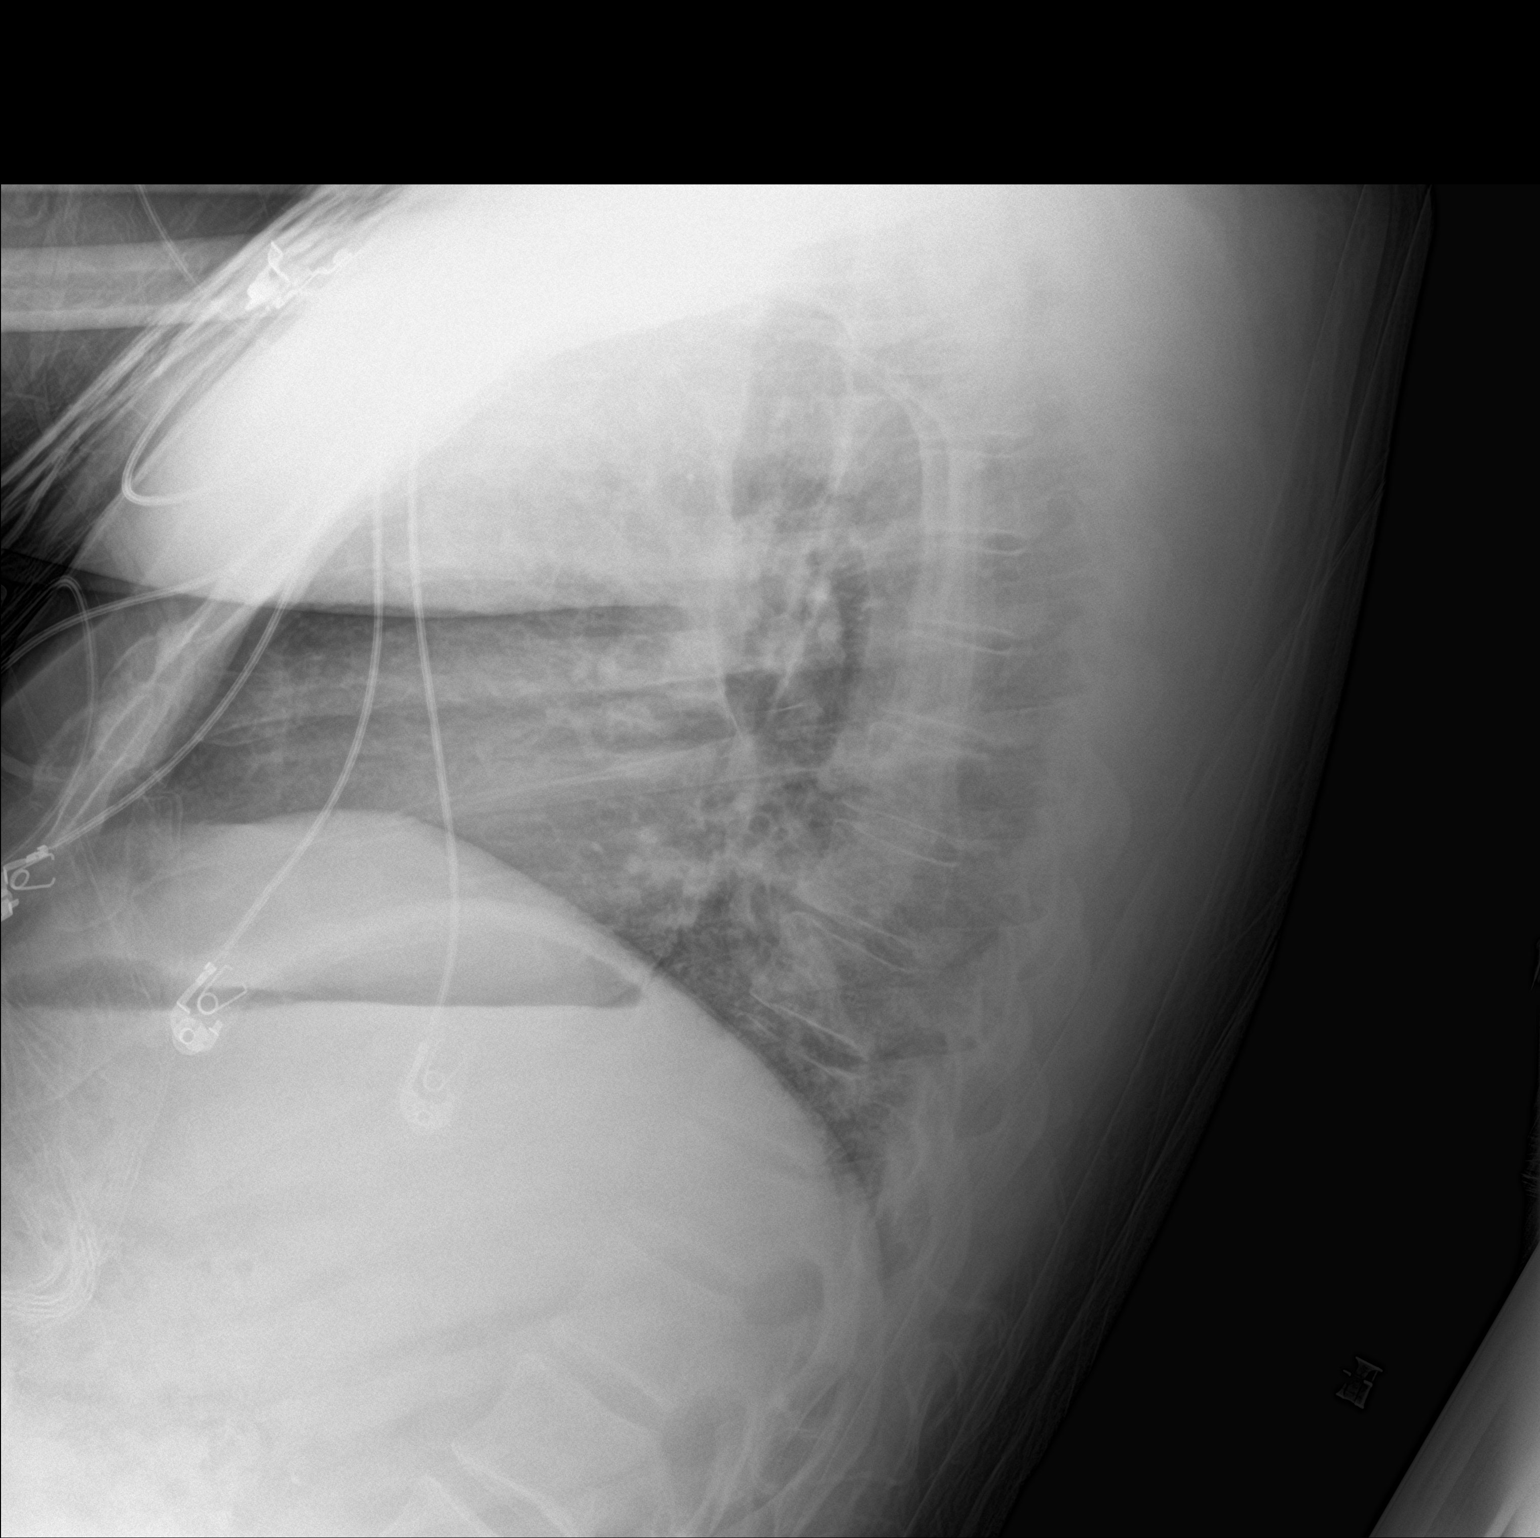

[chest ap]
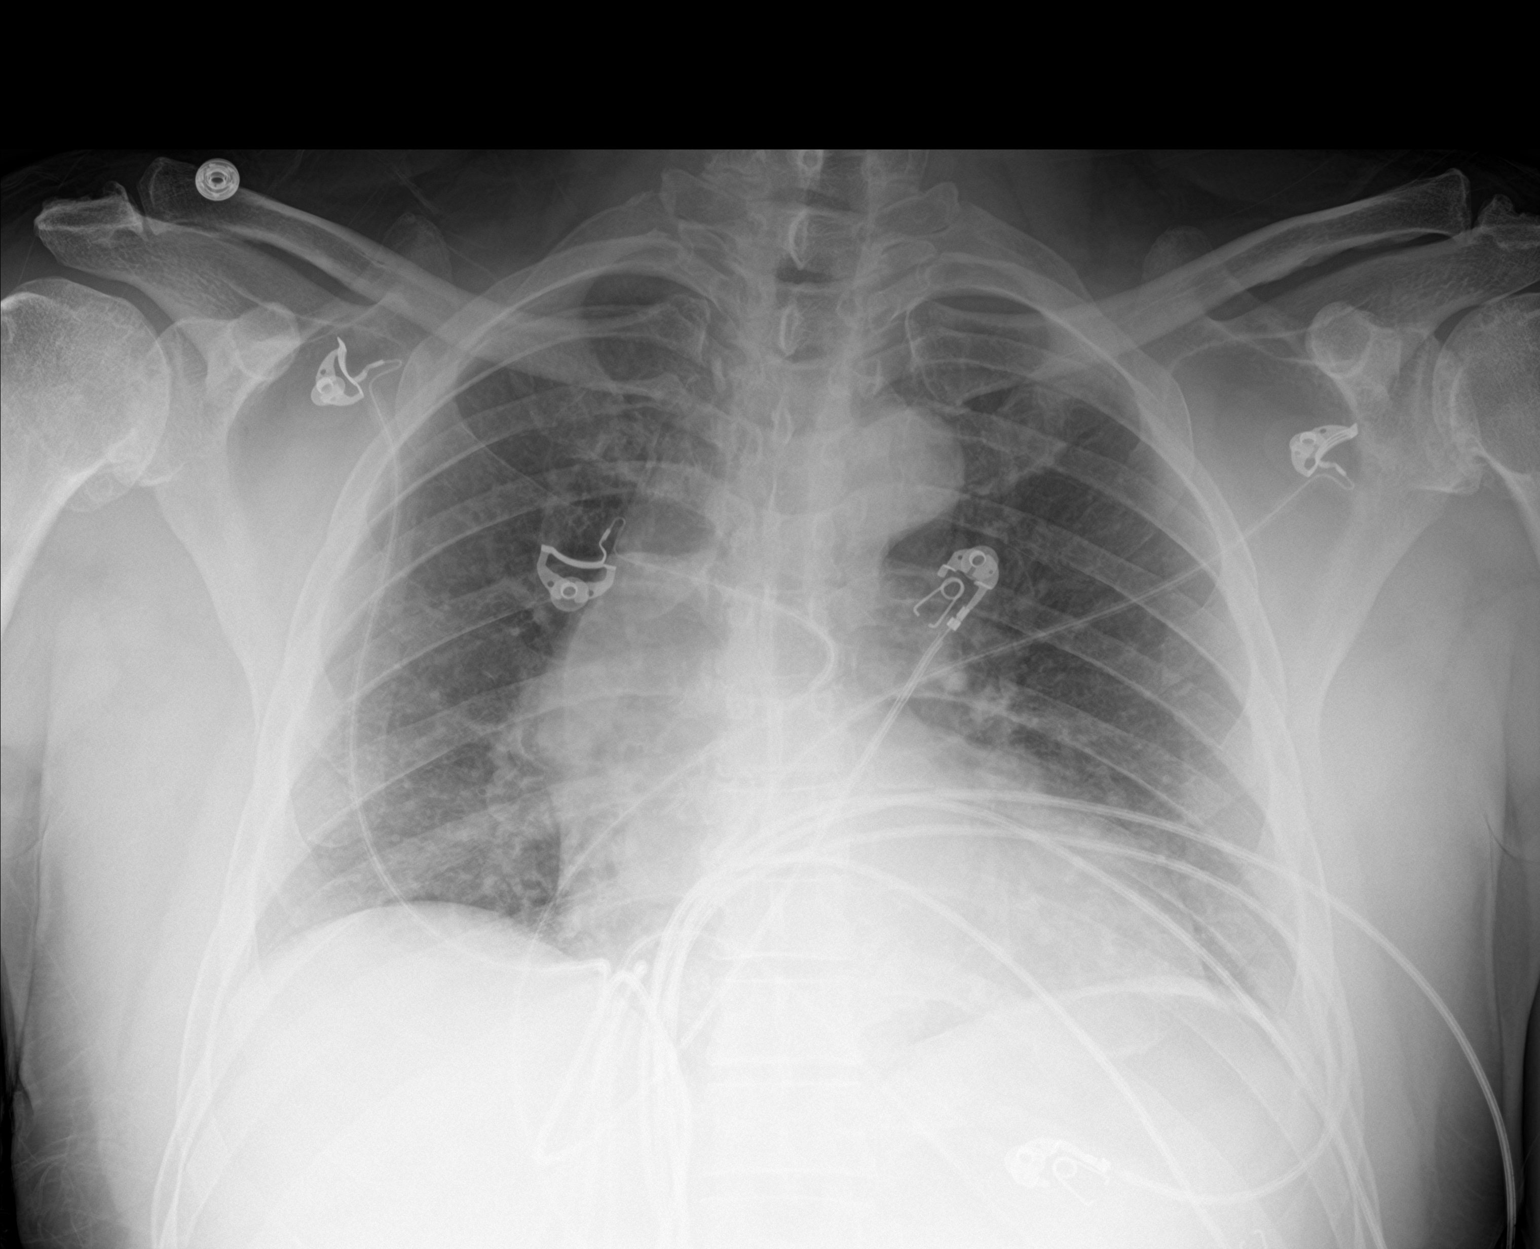

[2 of 2 positions shown; findings below may reference images not displayed]

FINDINGS: Cardiomegaly with tortuous ectatic thoracic aorta. Minimal aortic
atherosclerosis. No pneumonic consolidation, effusion or
pneumothorax. No acute nor suspicious osseous abnormalities.
Degenerative joint space narrowing spurring about both AC and
glenohumeral joints.
IMPRESSION: Stable cardiomegaly with tortuous ectatic thoracic aorta. Aortic
atherosclerosis. No acute pulmonary disease.

## 2018-12-03 IMAGING — CT CT HEAD W/O CM
4 series · 16 of 47 positions shown, 18 images · non-contrast
Comparison: 12/30/2014

CLINICAL DATA: Headache, dizziness and nausea. Syncopal episodes
x1.

EXAM:
CT HEAD WITHOUT CONTRAST
TECHNIQUE: Contiguous axial images were obtained from the base of the skull
through the vertex without intravenous contrast.

[Series 3: head without · axial · non-contrast · 0.46mm/px · z∈[-130,+0]mm · 7 of 36 slices shown, 9 images]
[im 5/36  brain]
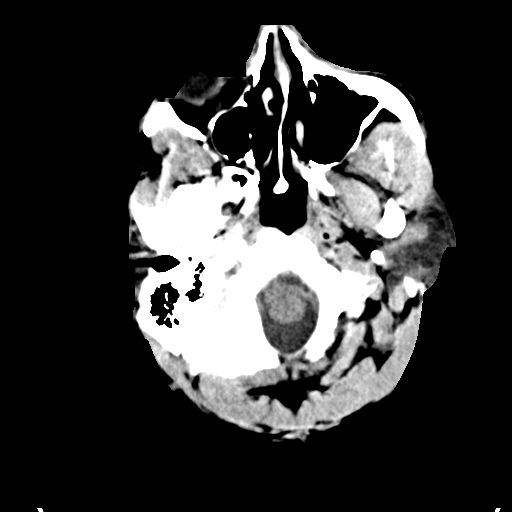
[im 5/36  bone]
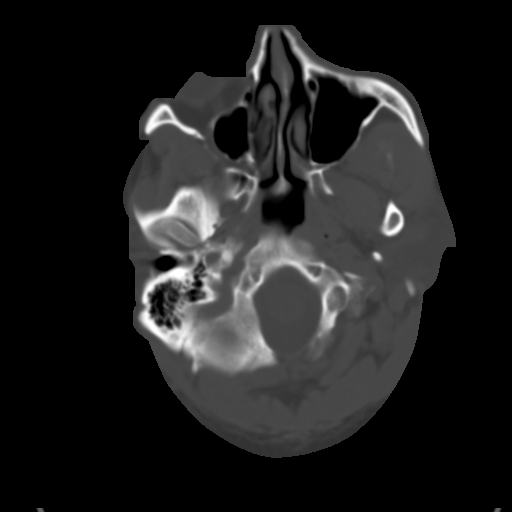
[im 9/36  brain]
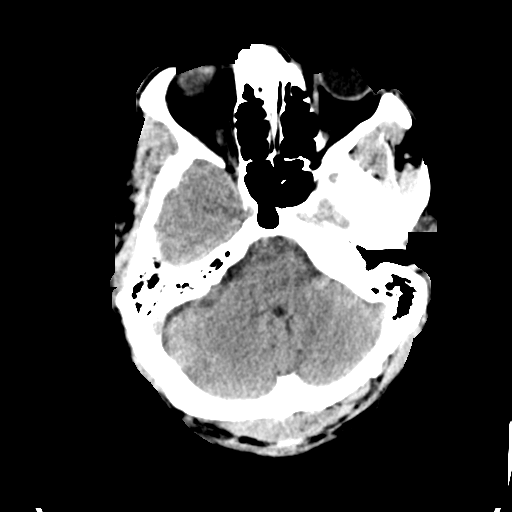
[im 14/36  brain]
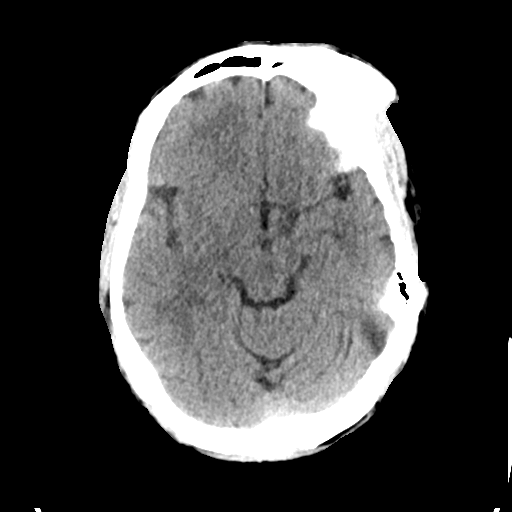
[im 18/36  brain]
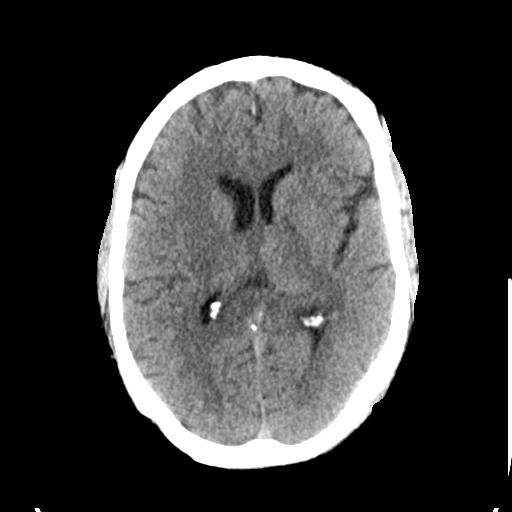
[im 22/36  brain]
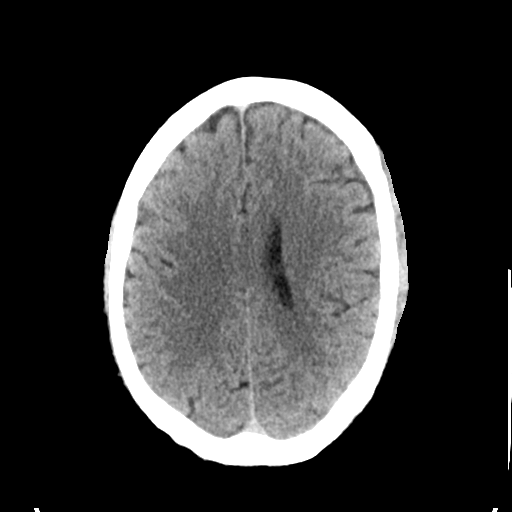
[im 22/36  bone]
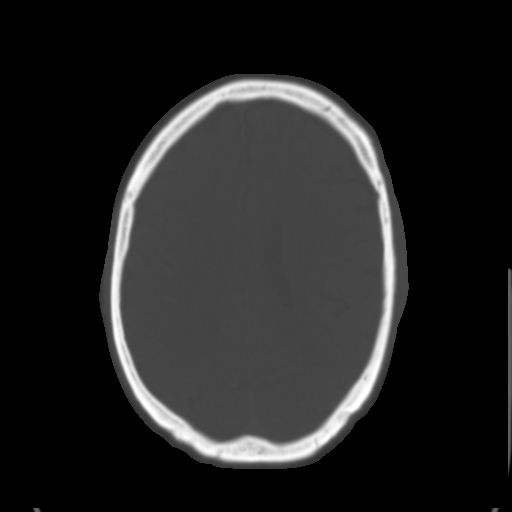
[im 27/36  brain]
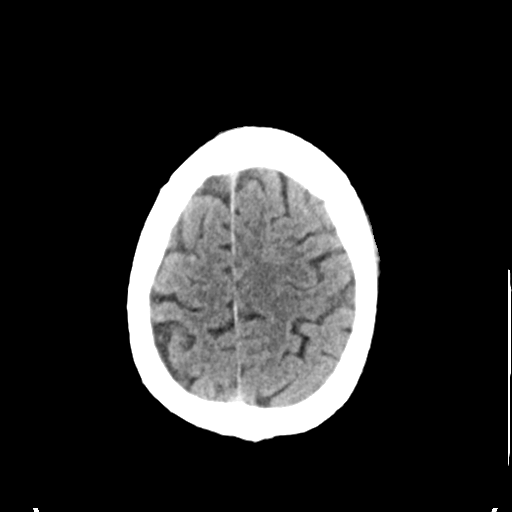
[im 31/36  brain]
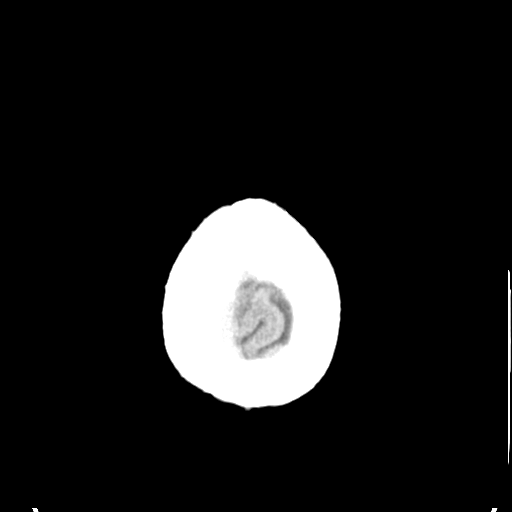

[Series 4: head bone · axial · 0.46mm/px · z∈[-134,-98]mm · 3 of 89 slices shown]
[im 9/89  bone]
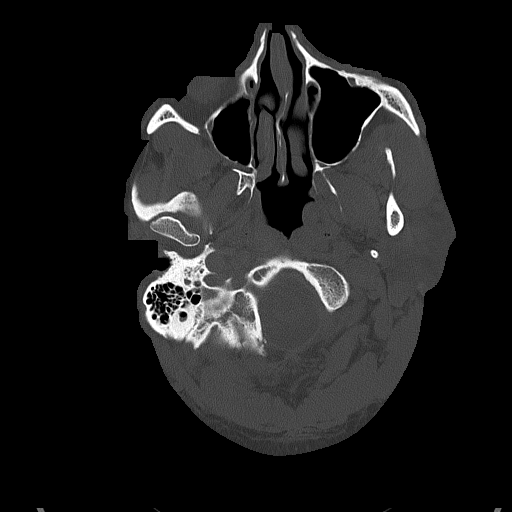
[im 18/89  bone]
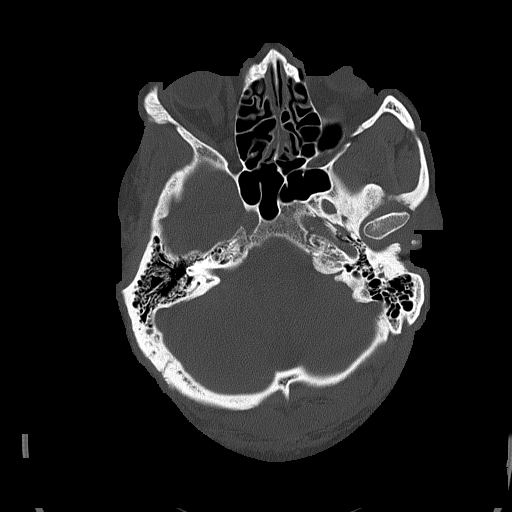
[im 27/89  bone]
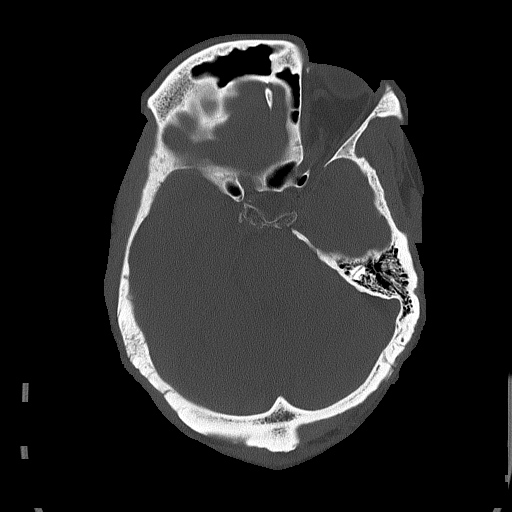

[Series 5: head without cor · coronal · non-contrast · 0.36mm/px · 3 of 78 slices shown]
[im 27/78  brain]
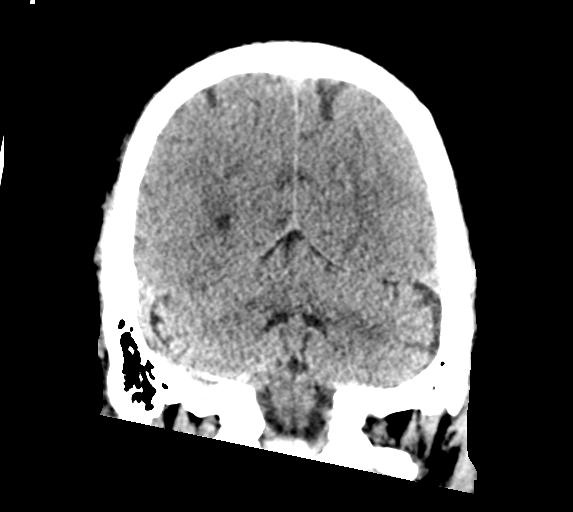
[im 35/78  brain]
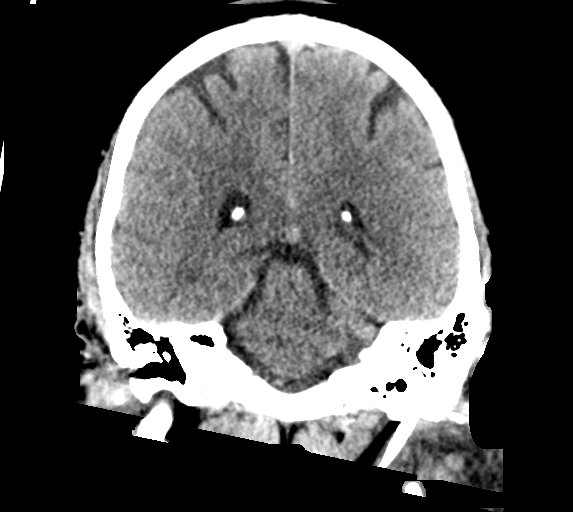
[im 43/78  brain]
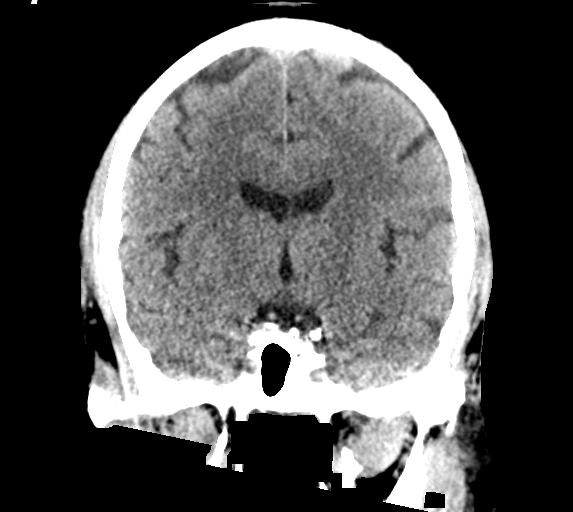

[Series 6: head without sag · sagittal · non-contrast · 0.35mm/px · 3 of 66 slices shown]
[im 27/66  brain]
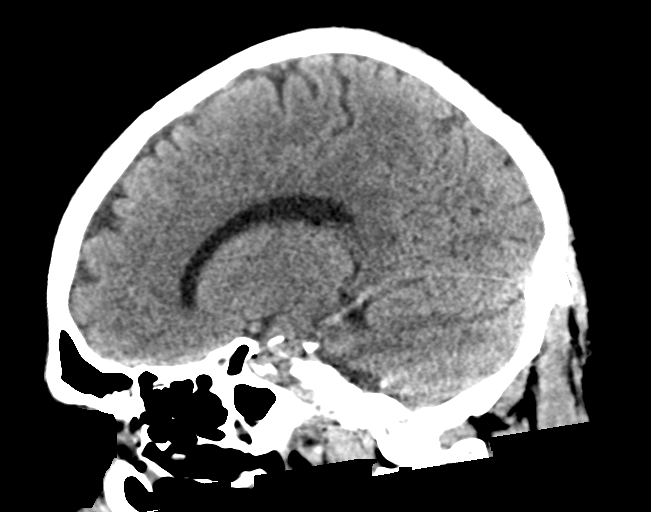
[im 33/66  brain]
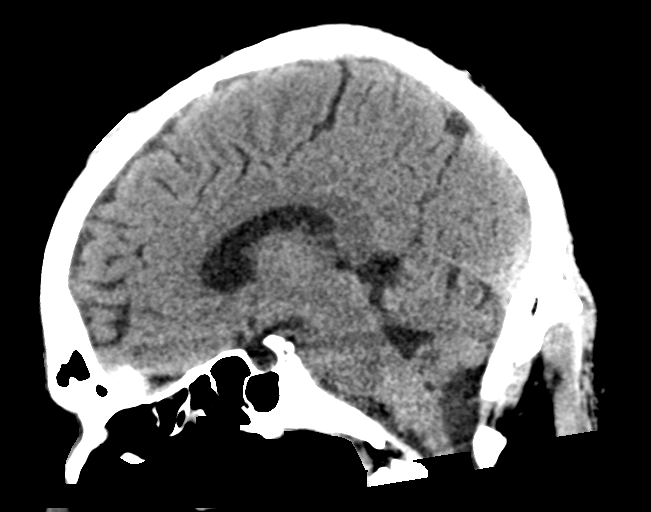
[im 38/66  brain]
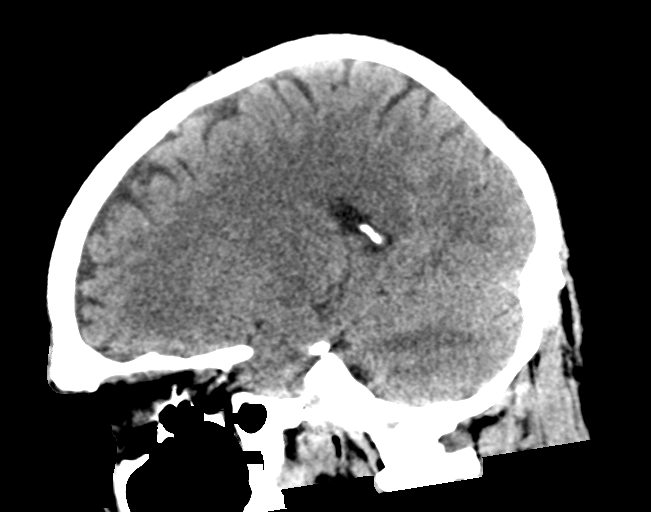

[16 of 47 positions shown; findings below may reference images not displayed]

FINDINGS: Brain: No evidence of acute infarction, hemorrhage, hydrocephalus,
extra-axial collection or mass lesion/mass effect.

Vascular: No hyperdense vessel or unexpected calcification.

Skull: Normal. Negative for fracture or focal lesion.

Sinuses/Orbits: No acute finding.

Other: A few calcifications are noted of the left parotid gland that
may represent sialoliths.
IMPRESSION: 1. No acute intracranial abnormality.
2. Nonobstructing sialoliths of the left parotid gland.

## 2019-02-24 ENCOUNTER — Other Ambulatory Visit: Payer: Self-pay

## 2019-02-24 DIAGNOSIS — Z20822 Contact with and (suspected) exposure to covid-19: Secondary | ICD-10-CM

## 2019-02-26 LAB — NOVEL CORONAVIRUS, NAA: SARS-CoV-2, NAA: NOT DETECTED

## 2019-09-13 ENCOUNTER — Other Ambulatory Visit: Payer: Self-pay

## 2019-09-13 DIAGNOSIS — N138 Other obstructive and reflux uropathy: Secondary | ICD-10-CM

## 2019-09-14 ENCOUNTER — Other Ambulatory Visit: Payer: Self-pay

## 2019-09-15 ENCOUNTER — Other Ambulatory Visit: Payer: Self-pay

## 2019-09-15 DIAGNOSIS — N401 Enlarged prostate with lower urinary tract symptoms: Secondary | ICD-10-CM

## 2019-09-16 LAB — PSA: Prostate Specific Ag, Serum: 1.2 ng/mL (ref 0.0–4.0)

## 2019-09-22 ENCOUNTER — Encounter: Payer: Self-pay | Admitting: Urology

## 2019-09-22 ENCOUNTER — Other Ambulatory Visit: Payer: Self-pay

## 2019-09-22 ENCOUNTER — Ambulatory Visit (INDEPENDENT_AMBULATORY_CARE_PROVIDER_SITE_OTHER): Payer: Medicare Other | Admitting: Urology

## 2019-09-22 VITALS — BP 151/88 | HR 81 | Ht 70.0 in | Wt 225.0 lb

## 2019-09-22 DIAGNOSIS — N401 Enlarged prostate with lower urinary tract symptoms: Secondary | ICD-10-CM | POA: Diagnosis not present

## 2019-09-22 DIAGNOSIS — N138 Other obstructive and reflux uropathy: Secondary | ICD-10-CM

## 2019-09-22 LAB — MICROSCOPIC EXAMINATION
Bacteria, UA: NONE SEEN
RBC: NONE SEEN /hpf (ref 0–2)

## 2019-09-22 LAB — URINALYSIS, COMPLETE
Bilirubin, UA: NEGATIVE
Glucose, UA: NEGATIVE
Ketones, UA: NEGATIVE
Leukocytes,UA: NEGATIVE
Nitrite, UA: NEGATIVE
Protein,UA: NEGATIVE
RBC, UA: NEGATIVE
Specific Gravity, UA: 1.01 (ref 1.005–1.030)
Urobilinogen, Ur: 0.2 mg/dL (ref 0.2–1.0)
pH, UA: 5 (ref 5.0–7.5)

## 2019-09-22 MED ORDER — TADALAFIL 20 MG PO TABS
20.0000 mg | ORAL_TABLET | Freq: Every day | ORAL | 6 refills | Status: DC | PRN
Start: 2019-09-22 — End: 2020-10-16

## 2019-09-22 NOTE — Progress Notes (Signed)
09/22/2019 11:20 AM   Elijah Wagner 05/28/1955 [id number]  Referring provider: Armando Gang, FNP [address] [place],  [place] [address]  Chief Complaint  Patient presents with  . Benign Prostatic Hypertrophy    HPI: Elijah Wagner is a 64 y.o.  with BPH and LU TS and ED who presents today for a one year follow up.  BPH WITH LUTS  (prostate and/or bladder) IPSS score: 2/0     PVR: 0 mL  Previous score: 2/0    Major complaint(s):  None.   Denies any dysuria, hematuria or suprapubic pain.   Denies any recent fevers, chills, nausea or vomiting.   Patient's brother with either an elevated PSA or with prostate cancer on AS as he is undergoing a second biopsy, but he is not sure which issue the biopsy is addressing.   IPSS    Row Name 09/22/19 1000         International Prostate Symptom Score   How often have you had the sensation of not emptying your bladder? Not at All     How often have you had to urinate less than every two hours? Not at All     How often have you found you stopped and started again several times when you urinated? Not at All     How often have you found it difficult to postpone urination? Not at All     How often have you had a weak urinary stream? Not at All     How often have you had to strain to start urination? Not at All     How many times did you typically get up at night to urinate? 2 Times     Total IPSS Score 2       Quality of Life due to urinary symptoms   If you were to spend the rest of your life with your urinary condition just the way it is now how would you feel about that? Pleased            Score:  1-7 Mild 8-19 Moderate 20-35 Severe   Erectile dysfunction He has a 44 year old girlfriend and he feels that his libido has reduced and he cannot keep up with her.  SHIM score is 22.   His previous SHIM score was 21.   which is mild ED.   He has been having difficulty with erections for a while.   His major complaint is  inconsitency with erections.  His risk factors for ED are age, BPH and HTN.  He denies any painful erections or curvatures with his erections.   He is still having having spontaneous erections.  He has success with Cialis 20 mg.   SHIM    Row Name 09/22/19 1050         SHIM: Over the last 6 months:   How do you rate your confidence that you could get and keep an erection? Moderate     When you had erections with sexual stimulation, how often were your erections hard enough for penetration (entering your partner)? Most Times (much more than half the time)     During sexual intercourse, how often were you able to maintain your erection after you had penetrated (entered) your partner? Almost Always or Always     During sexual intercourse, how difficult was it to maintain your erection to completion of intercourse? Not Difficult     When you attempted sexual intercourse, how often was it  satisfactory for you? Almost Always or Always       SHIM Total Score   SHIM 22            Score: 1-7 Severe ED 8-11 Moderate ED 12-16 Mild-Moderate ED 17-21 Mild ED 22-25 No ED   PMH: Past Medical History:  Diagnosis Date  . Hypertension     Surgical History: Past Surgical History:  Procedure Laterality Date  . HYDROCELE EXCISION / REPAIR    . TONSILLECTOMY      Home Medications:  Allergies as of 09/22/2019   No Known Allergies     Medication List       Accurate as of September 22, 2019 11:20 AM. If you have any questions, ask your nurse or doctor.        STOP taking these medications   lisinopril 10 MG tablet Commonly known as: ZESTRIL Stopped by: Bolivar Koranda, [name]   sildenafil 20 MG tablet Commonly known as: REVATIO Stopped by: Yarel Kilcrease, [name]     TAKE these medications   amLODipine 10 MG tablet Commonly known as: NORVASC Take 10 mg by mouth daily.   aspirin 81 MG EC tablet Take by mouth.   tadalafil 20 MG tablet Commonly known as: CIALIS Take 1 tablet (20 mg  total) by mouth daily as needed for erectile dysfunction.       Allergies: No Known Allergies  Family History: Family History  Problem Relation Age of Onset  . Leukemia Father   . Sarcoidosis Mother   . Diabetes Mother   . Bladder Cancer Neg Hx   . Prostate cancer Neg Hx   . Kidney cancer Neg Hx   . Sudden Cardiac Death Neg Hx   . Arrhythmia Neg Hx   . Heart attack Neg Hx     Social History:  reports that he quit smoking about 6 years ago. He has never used smokeless tobacco. He reports that he does not drink alcohol and does not use drugs.  ROS: For pertinent review of systems please refer to history of present illness  Physical Exam: BP (!) 151/88   Pulse 81   Ht 5\' 10"  (1.778 m)   Wt 225 lb (102.1 kg)   BMI 32.28 kg/m   Constitutional:  Well nourished. Alert and oriented, No acute distress. HEENT: [place] AT, mask in place.  Trachea midline Cardiovascular: No clubbing, cyanosis, or edema. Respiratory: Normal respiratory effort, no increased work of breathing. GI: Abdomen is soft, non tender, non distended, no abdominal masses. Liver and spleen not palpable.  No hernias appreciated.  Stool sample for occult testing is not indicated.   GU: No CVA tenderness.  No bladder fullness or masses.  Patient with circumcised phallus. Urethral meatus is patent.  No penile discharge. No penile lesions or rashes. Scrotum without lesions, cysts, rashes and/or edema.  Testicles are located scrotally bilaterally. No masses are appreciated in the testicles. Left and right epididymis are normal. Rectal: Patient with  normal sphincter tone. Anus and perineum without scarring or rashes. No rectal masses are appreciated.  External hemorrhoids noted.  Prostate is approximately 50 grams, no nodules are appreciated. Seminal vesicles could not be palpated Skin: No rashes, bruises or suspicious lesions. Lymph: No inguinal adenopathy. Neurologic: Grossly intact, no focal deficits, moving all 4  extremities. Psychiatric: Normal mood and affect.  Laboratory Data: PSA History  Component     Latest Ref Rng & Units 05/21/2015 07/16/2016 08/26/2017 09/14/2018  Prostate Specific Ag, Serum  0.0 - 4.0 ng/mL 0.9 0.8 0.9 0.9   Component     Latest Ref Rng & Units 09/15/2019  Prostate Specific Ag, Serum     0.0 - 4.0 ng/mL 1.2    Lab Results  Component Value Date   WBC 15.2 (H) 10/10/2016   HGB 13.2 10/10/2016   HCT 37.6 (L) 10/10/2016   MCV 96.4 10/10/2016   PLT 269 10/10/2016    Lab Results  Component Value Date   CREATININE 1.23 10/10/2016   Urinalysis Component     Latest Ref Rng & Units 09/22/2019  Specific Gravity, UA     1.005 - 1.030 1.010  pH, UA     5.0 - 7.5 5.0  Color, UA     Yellow Yellow  Appearance Ur     Clear Clear  Leukocytes,UA     Negative Negative  Protein,UA     Negative/Trace Negative  Glucose, UA     Negative Negative  Ketones, UA     Negative Negative  RBC, UA     Negative Negative  Bilirubin, UA     Negative Negative  Urobilinogen, Ur     0.2 - 1.0 mg/dL 0.2  Nitrite, UA     Negative Negative  Microscopic Examination      See below:   Component     Latest Ref Rng & Units 09/22/2019          WBC, UA     0 - 5 /hpf 0-5  RBC     0 - 2 /hpf None seen  Epithelial Cells (non renal)     0 - 10 /hpf 0-10  Bacteria, UA     None seen/Few None seen   I have reviewed the labs.  Pertinent Imaging Results for OKIE, BOGACZ (MRN [id number]) as of 09/22/2018 11:29  Ref. Range 09/22/2018 09:40  Scan Result Unknown 0    Assessment & Plan:    1. BPH with LU TS I PSS score is 2/0, it is stable Continue conservative management, avoiding bladder irritants and timed voiding's RTC in 12 months for IPSS, PSA and exam   3. Erectile dysfunction SHIM score 22 Refill tadalafil (Cialis) 20 mg, one tablet 1-2 hours prior to intercourse, # 30 with 3 refills RTC in one year for SHIM and exam   Return in about 1 year (around 09/21/2020) for IPSS,  SHIM, PSA and exam.  Michiel Cowboy, [facility]  [facility] [address] [place], [place] [address] [phone]

## 2019-12-17 ENCOUNTER — Emergency Department: Payer: Medicare Other

## 2019-12-17 ENCOUNTER — Emergency Department
Admission: EM | Admit: 2019-12-17 | Discharge: 2019-12-17 | Disposition: A | Discharge status: Home / Self Care | Payer: Medicare Other | Attending: Emergency Medicine | Admitting: Emergency Medicine

## 2019-12-17 DIAGNOSIS — J40 Bronchitis, not specified as acute or chronic: Secondary | ICD-10-CM | POA: Insufficient documentation

## 2019-12-17 DIAGNOSIS — I10 Essential (primary) hypertension: Secondary | ICD-10-CM | POA: Diagnosis not present

## 2019-12-17 DIAGNOSIS — Z7982 Long term (current) use of aspirin: Secondary | ICD-10-CM | POA: Diagnosis not present

## 2019-12-17 DIAGNOSIS — R05 Cough: Secondary | ICD-10-CM | POA: Diagnosis present

## 2019-12-17 DIAGNOSIS — Z20822 Contact with and (suspected) exposure to covid-19: Secondary | ICD-10-CM | POA: Insufficient documentation

## 2019-12-17 DIAGNOSIS — J069 Acute upper respiratory infection, unspecified: Secondary | ICD-10-CM | POA: Diagnosis not present

## 2019-12-17 DIAGNOSIS — Z79899 Other long term (current) drug therapy: Secondary | ICD-10-CM | POA: Diagnosis not present

## 2019-12-17 DIAGNOSIS — Z87891 Personal history of nicotine dependence: Secondary | ICD-10-CM | POA: Diagnosis not present

## 2019-12-17 LAB — CBC
HCT: 39.5 % (ref 39.0–52.0)
Hemoglobin: 14.1 g/dL (ref 13.0–17.0)
MCH: 33.3 pg (ref 26.0–34.0)
MCHC: 35.7 g/dL (ref 30.0–36.0)
MCV: 93.2 fL (ref 80.0–100.0)
Platelets: 386 10*3/uL (ref 150–400)
RBC: 4.24 MIL/uL (ref 4.22–5.81)
RDW: 11.6 % (ref 11.5–15.5)
WBC: 18.4 10*3/uL — ABNORMAL HIGH (ref 4.0–10.5)
nRBC: 0 % (ref 0.0–0.2)

## 2019-12-17 LAB — COMPREHENSIVE METABOLIC PANEL
ALT: 21 U/L (ref 0–44)
AST: 21 U/L (ref 15–41)
Albumin: 4.4 g/dL (ref 3.5–5.0)
Alkaline Phosphatase: 86 U/L (ref 38–126)
Anion gap: 10 (ref 5–15)
BUN: 18 mg/dL (ref 8–23)
CO2: 25 mmol/L (ref 22–32)
Calcium: 9.8 mg/dL (ref 8.9–10.3)
Chloride: 102 mmol/L (ref 98–111)
Creatinine, Ser: 1.13 mg/dL (ref 0.61–1.24)
GFR calc Af Amer: >60 mL/min (ref >60)
GFR calc non Af Amer: >60 mL/min (ref >60)
Glucose, Bld: 110 mg/dL — ABNORMAL HIGH (ref 70–99)
Potassium: 4.5 mmol/L (ref 3.5–5.1)
Sodium: 137 mmol/L (ref 135–145)
Total Bilirubin: 0.7 mg/dL (ref 0.3–1.2)
Total Protein: 8.2 g/dL — ABNORMAL HIGH (ref 6.5–8.1)

## 2019-12-17 LAB — RESPIRATORY PANEL BY RT PCR (FLU A&B, COVID)
Influenza A by PCR: NEGATIVE
Influenza B by PCR: NEGATIVE
SARS Coronavirus 2 by RT PCR: NEGATIVE

## 2019-12-17 LAB — TROPONIN I (HIGH SENSITIVITY): Troponin I (High Sensitivity): 4 ng/L (ref <18)

## 2019-12-17 MED ORDER — DOXYCYCLINE HYCLATE 100 MG PO TABS
100.0000 mg | ORAL_TABLET | Freq: Once | ORAL | Status: AC
  Administered 2019-12-17: 100 mg via ORAL
  Filled 2019-12-17: qty 1

## 2019-12-17 MED ORDER — PREDNISONE 20 MG PO TABS: 40.0000 mg | ORAL_TABLET | Freq: Every day | ORAL | 0 refills | Status: AC

## 2019-12-17 MED ORDER — PREDNISONE 20 MG PO TABS
60.0000 mg | ORAL_TABLET | Freq: Once | ORAL | Status: AC
  Administered 2019-12-17: 60 mg via ORAL
  Filled 2019-12-17: qty 3

## 2019-12-17 MED ORDER — HYDROCODONE-HOMATROPINE 5-1.5 MG/5ML PO SYRP: 5.0000 mL | ORAL_SOLUTION | Freq: Four times a day (QID) | ORAL | 0 refills | Status: AC | PRN

## 2019-12-17 MED ORDER — DOXYCYCLINE HYCLATE 100 MG PO CAPS: 100.0000 mg | ORAL_CAPSULE | Freq: Two times a day (BID) | ORAL | 0 refills | Status: AC

## 2019-12-17 MED ORDER — ALBUTEROL SULFATE HFA 108 (90 BASE) MCG/ACT IN AERS
2.0000 | INHALATION_SPRAY | Freq: Once | RESPIRATORY_TRACT | Status: AC
  Administered 2019-12-17: 2 via RESPIRATORY_TRACT
  Filled 2019-12-17: qty 6.7

## 2019-12-17 NOTE — Discharge Instructions (Signed)
Try the [facility] or [facility] to set up follow-up with a new PCP if desired  Use the albuterol inhaler every 4-6 hours for wheezing as needed  Take the full course of prednisone and antibiotics

## 2019-12-17 NOTE — ED Notes (Signed)
This tech tried to obtain blood sample and was unsuccessful. Lab called to obtain.

## 2019-12-17 NOTE — ED Triage Notes (Signed)
Patient with deep cough over the last week. States he has overall weakness and fatigue. Denies N/V. Gets a "bit sweaty" but checked his temp and had no fever. Denies known Covid contacts.

## 2019-12-17 NOTE — ED Notes (Signed)
This tech tried to obtain blood but was unsuccessful. Lab to be called to obtain specimen.

## 2019-12-17 NOTE — ED Provider Notes (Addendum)
[facility] Emergency Department Provider Note  ____________________________________________   First MD Initiated Contact with Patient 12/17/19 1500     (approximate)  I have reviewed the triage vital signs and the nursing notes.   HISTORY  Chief Complaint  Cough (Productive of green sputum) and Fatigue    HPI Elijah Wagner is a 64 y.o. male with past medical history of hypertension here with cough, shortness of breath, subjective fevers.  Patient states symptoms started proximately 2 weeks ago with sinus pressure, nasal congestion, and mild sore throat.  States that he feels like this moved down towards his chest and he has developed a cough with occasional yellow-green sputum production over the last several days.  He took Augmentin that he had left at home, which did not improve his symptoms.  He had a Covid test several days ago that he does not know the results of.  Denies any known Covid exposures.  He states that over the last several days, his cough has persisted and he has felt occasionally short of breath.  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  He has a remote history of smoking, but denies known history of COPD.  No other complaints.        Past Medical History:  Diagnosis Date  . Hypertension     Patient Active Problem List   Diagnosis Date Noted  . Syncope 10/10/2016  . Essential hypertension 10/10/2016  . Leukocytosis 10/10/2016    Past Surgical History:  Procedure Laterality Date  . HYDROCELE EXCISION / REPAIR    . TONSILLECTOMY      Prior to Admission medications   Medication Sig Start Date End Date Taking? Authorizing Provider  amLODipine (NORVASC) 10 MG tablet Take 10 mg by mouth daily. 08/22/19   [provider]  aspirin 81 MG EC tablet Take by mouth.    [provider]  doxycycline (VIBRAMYCIN) 100  MG capsule Take 1 capsule (100 mg total) by mouth 2 (two) times daily for 7 days. 12/17/19 12/24/19  Shaune Pollack, MD  HYDROcodone-homatropine [facility]) 5-1.5 MG/5ML syrup Take 5 mLs by mouth every 6 (six) hours as needed for up to 3 days for cough. 12/17/19 12/20/19  Shaune Pollack, MD  predniSONE (DELTASONE) 20 MG tablet Take 2 tablets (40 mg total) by mouth daily for 5 days. 12/17/19 12/22/19  Shaune Pollack, MD  tadalafil (CIALIS) 20 MG tablet Take 1 tablet (20 mg total) by mouth daily as needed for erectile dysfunction. 09/22/19   Michiel Cowboy A, [name]    Allergies Patient has no known allergies.  Family History  Problem Relation Age of Onset  . Leukemia Father   . Sarcoidosis Mother   . Diabetes Mother   . Bladder Cancer Neg Hx   . Prostate cancer Neg Hx   . Kidney cancer Neg Hx   . Sudden Cardiac Death Neg Hx   . Arrhythmia Neg Hx   . Heart attack Neg Hx     Social History Social History   Tobacco Use  . Smoking status: Former Smoker    Quit date: 2015    Years since quitting: 6.7  . Smokeless tobacco: Never Used  Vaping Use  . Vaping Use: Never used  Substance Use Topics  . Alcohol use: No  . Drug use: No    Review of Systems  Review of Systems  Constitutional: Positive for fatigue. Negative for chills and fever.  HENT: Negative for sore throat.   Respiratory: Positive for cough, shortness of breath and wheezing.   Cardiovascular: Negative for chest pain.  Gastrointestinal: Negative for abdominal pain.  Genitourinary: Negative for flank pain.  Musculoskeletal: Negative for neck pain.  Skin: Negative for rash and wound.  Allergic/Immunologic: Negative for immunocompromised state.  Neurological: Positive for weakness. Negative for numbness.  Hematological: Does not bruise/bleed easily.  All other systems reviewed and are negative.    ____________________________________________  PHYSICAL EXAM:      VITAL SIGNS: ED Triage Vitals  Enc Vitals Group      BP 12/17/19 0744 (!) 148/82     Pulse Rate 12/17/19 0744 88     Resp 12/17/19 0744 20     Temp 12/17/19 0744 98.3 F (36.8 C)     Temp Source 12/17/19 0744 Oral     SpO2 12/17/19 0744 90 %     Weight 12/17/19 0747 220 lb (99.8 kg)     Height 12/17/19 0747 5\' 10"  (1.778 m)     Head Circumference --      Peak Flow --      Pain Score 12/17/19 0746 9     Pain Loc --      Pain Edu? --      Excl. in GC? --      Physical Exam Vitals and nursing note reviewed.  Constitutional:      General: He is not in acute distress.    Appearance: He is well-developed.  HENT:     Head: Normocephalic and atraumatic.  Eyes:     Conjunctiva/sclera: Conjunctivae normal.  Cardiovascular:     Rate and Rhythm: Normal rate and regular rhythm.     Heart sounds: Normal heart sounds. No murmur heard.  No friction rub.  Pulmonary:     Effort: Pulmonary effort is normal. No respiratory distress.     Breath sounds: Normal breath sounds. No wheezing (Scattered, diffuse wheezing, but normal work of breathing) or rales.  Abdominal:     General: There is no distension.     Palpations: Abdomen is soft.     Tenderness: There is no abdominal tenderness.  Musculoskeletal:     Cervical back: Neck supple.  Skin:    General: Skin is warm.     Capillary Refill: Capillary refill takes less than 2 seconds.  Neurological:     Mental Status: He is alert and oriented to person, place, and time.     Motor: No abnormal muscle tone.       ____________________________________________   LABS (all labs ordered are listed, but only abnormal results are displayed)  Labs Reviewed  CBC - Abnormal; Notable for the following components:      Result Value   WBC 18.4 (*)    All other components within normal limits  COMPREHENSIVE METABOLIC PANEL - Abnormal; Notable for the following components:   Glucose, Bld 110 (*)    Total Protein 8.2 (*)    All other components within normal limits  RESPIRATORY PANEL BY RT PCR (FLU  A&B, COVID)  TROPONIN I (HIGH SENSITIVITY)    ____________________________________________  EKG: Normal sinus rhythm, ventricular rate 84.  PR 166, QRS 80, QTc 430.  No acute ST elevations or depressions. ________________________________________  RADIOLOGY All imaging, including plain films, CT scans, and ultrasounds, independently reviewed by me, and interpretations confirmed via formal radiology reads.  ED MD interpretation:   Chest x-ray: No acute  abnormality  Official radiology report(s): DG Chest 2 View  Result Date: 12/17/2019 CLINICAL DATA:  Cough for 1 week. EXAM: CHEST - 2 VIEW COMPARISON:  10/10/2016 chest radiograph FINDINGS: Cardiomediastinal silhouette is unchanged with possible ectatic/tortuous ascending thoracic aorta. There is no evidence of focal airspace disease, pulmonary edema, suspicious pulmonary nodule/mass, pleural effusion, or pneumothorax. No acute bony abnormalities are identified. IMPRESSION: No active cardiopulmonary disease. Electronically Signed   By: Harmon Pier M.D.   On: 12/17/2019 09:04    ____________________________________________  PROCEDURES   Procedure(s) performed (including Critical Care):  Procedures  ____________________________________________  INITIAL IMPRESSION / MDM / ASSESSMENT AND PLAN / ED COURSE  As part of my medical decision making, I reviewed the following data within the electronic MEDICAL RECORD NUMBER Nursing notes reviewed and incorporated, Old chart reviewed, Notes from prior ED visits, and [place] Controlled Substance Database       *Arvell Pulsifer was evaluated in Emergency Department on 12/17/2019 for the symptoms described in the history of present illness. He was evaluated in the context of the global COVID-19 pandemic, which necessitated consideration that the patient might be at risk for infection with the SARS-CoV-2 virus that causes COVID-19. Institutional protocols and algorithms that pertain to the evaluation of  patients at risk for COVID-19 are in a state of rapid change based on information released by regulatory bodies including the CDC and federal and state organizations. These policies and algorithms were followed during the patient's care in the ED.  Some ED evaluations and interventions may be delayed as a result of limited staffing during the pandemic.*     Medical Decision Making: 64 year old male here with cough, subjective shortness of breath, and subjective fevers.  Patient is afebrile and hemodynamically stable here.  He has normal work of breathing.  Suspect ongoing bronchitis versus community-acquired pneumonia, possibly partially treated in the setting of his Augmentin use.  He has no evidence to suggest sepsis or systemic illness at this time.  EKG is nonischemic.  No symptoms or hypoxia or tachycardia to suggest PE or alternative etiology.  Will add steroids and albuterol given his smoking history, as well as treat for atypicals with doxycycline.  Return precautions given.  Outpatient Covid sent. Reviewed labs and chest x-ray.  She is gray shows no focal pneumonia, although this could have been partially treated as mentioned.  Outpatient Covid is pending.  CBC with mild leukocytosis but he has a mild chronic leukocytosis for which she will follow-up with a PCP.  CMP at baseline. ____________________________________________  FINAL CLINICAL IMPRESSION(S) / ED DIAGNOSES  Final diagnoses:  Upper respiratory tract infection, unspecified type  Bronchitis     MEDICATIONS GIVEN DURING THIS VISIT:  Medications  predniSONE (DELTASONE) tablet 60 mg (has no administration in time range)  albuterol (VENTOLIN HFA) 108 (90 Base) MCG/ACT inhaler 2 puff (has no administration in time range)  doxycycline (VIBRA-TABS) tablet 100 mg (has no administration in time range)     ED Discharge Orders         Ordered    doxycycline (VIBRAMYCIN) 100 MG capsule  2 times daily        12/17/19 1524     predniSONE (DELTASONE) 20 MG tablet  Daily        12/17/19 1524    HYDROcodone-homatropine (HYCODAN) 5-1.5 MG/5ML syrup  Every 6 hours PRN        12/17/19 1524           Note:  This document was prepared using  [employer] and may include unintentional dictation errors.   Shaune Pollack, MD 12/17/19 1525    Shaune Pollack, MD 12/17/19 [phone]

## 2020-01-19 ENCOUNTER — Ambulatory Visit: Payer: Medicare Other

## 2020-03-26 ENCOUNTER — Other Ambulatory Visit: Payer: Medicare Other

## 2020-03-31 ENCOUNTER — Other Ambulatory Visit: Payer: Medicare HMO

## 2020-09-19 ENCOUNTER — Other Ambulatory Visit: Payer: Self-pay

## 2020-09-19 DIAGNOSIS — N138 Other obstructive and reflux uropathy: Secondary | ICD-10-CM

## 2020-09-24 ENCOUNTER — Other Ambulatory Visit: Payer: Self-pay

## 2020-09-25 ENCOUNTER — Other Ambulatory Visit: Payer: Self-pay

## 2020-09-25 ENCOUNTER — Other Ambulatory Visit: Payer: Medicare HMO

## 2020-09-25 DIAGNOSIS — N138 Other obstructive and reflux uropathy: Secondary | ICD-10-CM

## 2020-09-26 LAB — PSA: Prostate Specific Ag, Serum: 1 ng/mL (ref 0.0–4.0)

## 2020-09-27 ENCOUNTER — Ambulatory Visit: Payer: Self-pay | Admitting: Urology

## 2020-10-01 ENCOUNTER — Ambulatory Visit: Payer: Self-pay | Admitting: Urology

## 2020-10-01 NOTE — Progress Notes (Deleted)
10/02/2020 12:50 PM   Elijah Wagner 1955/10/13 [id number]  Referring provider: Armando Gang, FNP [address] [place],  [place] [address]  Urological history: 1. BPH with LU TS -PSA 1.0 in 09/2020 -I PSS *** -PVR ***  2. Family history of prostate -brother with ? Prostate cancer  3. ED -contributing factors of age, BPH and HTN -SHIM *** -managed with tadalafil 20 mg, on-demand-dosing   No chief complaint on file.   HPI: Elijah Wagner is a 65 y.o.  who presents today for a one year follow up.     Score:  1-7 Mild 8-19 Moderate 20-35 Severe       Score: 1-7 Severe ED 8-11 Moderate ED 12-16 Mild-Moderate ED 17-21 Mild ED 22-25 No ED   PMH: Past Medical History:  Diagnosis Date   Hypertension     Surgical History: Past Surgical History:  Procedure Laterality Date   HYDROCELE EXCISION / REPAIR     TONSILLECTOMY      Home Medications:  Allergies as of 10/02/2020   No Known Allergies      Medication List        Accurate as of October 01, 2020 12:50 PM. If you have any questions, ask your nurse or doctor.          amLODipine 10 MG tablet Commonly known as: NORVASC Take 10 mg by mouth daily.   aspirin 81 MG EC tablet Take by mouth.   tadalafil 20 MG tablet Commonly known as: CIALIS Take 1 tablet (20 mg total) by mouth daily as needed for erectile dysfunction.        Allergies: No Known Allergies  Family History: Family History  Problem Relation Age of Onset   Leukemia Father    Sarcoidosis Mother    Diabetes Mother    Bladder Cancer Neg Hx    Prostate cancer Neg Hx    Kidney cancer Neg Hx    Sudden Cardiac Death Neg Hx    Arrhythmia Neg Hx    Heart attack Neg Hx     Social History:  reports that he quit smoking about 7 years ago. He has never used smokeless tobacco. He reports that he does not drink alcohol and does not use drugs.  ROS: For pertinent review of systems please refer to history of present  illness  Physical Exam: There were no vitals taken for this visit.  Constitutional:  Well nourished. Alert and oriented, No acute distress. HEENT: [place] AT, moist mucus membranes.  Trachea midline Cardiovascular: No clubbing, cyanosis, or edema. Respiratory: Normal respiratory effort, no increased work of breathing. GI: Abdomen is soft, non tender, non distended, no abdominal masses. Liver and spleen not palpable.  No hernias appreciated.  Stool sample for occult testing is not indicated.   GU: No CVA tenderness.  No bladder fullness or masses.  Patient with circumcised/uncircumcised phallus. ***Foreskin easily retracted***  Urethral meatus is patent.  No penile discharge. No penile lesions or rashes. Scrotum without lesions, cysts, rashes and/or edema.  Testicles are located scrotally bilaterally. No masses are appreciated in the testicles. Left and right epididymis are normal. Rectal: Patient with  normal sphincter tone. Anus and perineum without scarring or rashes. No rectal masses are appreciated. Prostate is approximately *** grams, *** nodules are appreciated. Seminal vesicles are normal. Skin: No rashes, bruises or suspicious lesions. Lymph: No inguinal adenopathy. Neurologic: Grossly intact, no focal deficits, moving all 4 extremities. Psychiatric: Normal mood and affect.   Laboratory Data: PSA History  Component     Latest Ref Rng & Units 09/25/2020  Prostate Specific Ag, Serum     0.0 - 4.0 ng/mL 1.0  I have reviewed the labs.  Pertinent Imaging N/A   Assessment & Plan:    1. BPH with LU TS I PSS score is 2/0, it is stable Continue conservative management, avoiding bladder irritants and timed voiding's RTC in 12 months for IPSS, PSA and exam   3. Erectile dysfunction SHIM score 22 Refill tadalafil (Cialis) 20 mg, one tablet 1-2 hours prior to intercourse, # 30 with 3 refills RTC in one year for SHIM and exam   No follow-ups on file.  Michiel Cowboy, [name]  [facility]  [facility] [address] [place], [place] [address] [phone]

## 2020-10-02 ENCOUNTER — Ambulatory Visit: Payer: Self-pay | Admitting: Urology

## 2020-10-15 NOTE — Progress Notes (Signed)
10/16/2020 2:26 PM   Elijah Wagner [birth year] [id number]  Referring provider: Armando Gang, FNP [address] [place],  [place] [address]  Urological history: 1. BPH with LU TS -PSA 1.0 in 09/2020 -I PSS 2/1 -PVR 0 mL  2. Family history of prostate -brother with ? Prostate cancer  3. ED -contributing factors of age, BPH and HTN -SHIM 24 -managed with tadalafil 20 mg, on-demand-dosing   Chief Complaint  Patient presents with   Benign Prostatic Hypertrophy     HPI: Elijah Wagner is a 65 y.o.  who presents today for a one year follow up.  He has no urinary complaints at this visit.  Patient denies any modifying or aggravating factors.  Patient denies any gross hematuria, dysuria or suprapubic/flank pain.  Patient denies any fevers, chills, nausea or vomiting.     IPSS     Row Name 10/16/20 1400         International Prostate Symptom Score   How often have you had the sensation of not emptying your bladder? Not at All     How often have you had to urinate less than every two hours? Not at All     How often have you found you stopped and started again several times when you urinated? Not at All     How often have you found it difficult to postpone urination? Less than 1 in 5 times     How often have you had a weak urinary stream? Not at All     How often have you had to strain to start urination? Not at All     How many times did you typically get up at night to urinate? 1 Time     Total IPSS Score 2           Quality of Life due to urinary symptoms     If you were to spend the rest of your life with your urinary condition just the way it is now how would you feel about that? Pleased              Score:  1-7 Mild 8-19 Moderate 20-35 Severe  Patient still having spontaneous erections.   He denies any pain or curvature with erections.     SHIM     Row Name 10/16/20 1454         SHIM: Over the last 6 months:   How do you rate your  confidence that you could get and keep an erection? High     When you had erections with sexual stimulation, how often were your erections hard enough for penetration (entering your partner)? Almost Always or Always     During sexual intercourse, how often were you able to maintain your erection after you had penetrated (entered) your partner? Almost Always or Always     During sexual intercourse, how difficult was it to maintain your erection to completion of intercourse? Not Difficult     When you attempted sexual intercourse, how often was it satisfactory for you? Almost Always or Always           SHIM Total Score     SHIM 24              Score: 1-7 Severe ED 8-11 Moderate ED 12-16 Mild-Moderate ED 17-21 Mild ED 22-25 No ED   PMH: Past Medical History:  Diagnosis Date   Hypertension     Surgical History: Past Surgical History:  Procedure Laterality  Date   HYDROCELE EXCISION / REPAIR     TONSILLECTOMY      Home Medications:  Allergies as of 10/16/2020   No Known Allergies      Medication List        Accurate as of October 16, 2020 11:59 PM. If you have any questions, ask your nurse or doctor.          amLODipine 10 MG tablet Commonly known as: NORVASC Take 10 mg by mouth daily.   aspirin 81 MG EC tablet Take by mouth.   tadalafil 20 MG tablet Commonly known as: CIALIS Take 1 tablet (20 mg total) by mouth daily as needed for erectile dysfunction.        Allergies: No Known Allergies  Family History: Family History  Problem Relation Age of Onset   Leukemia Father    Sarcoidosis Mother    Diabetes Mother    Bladder Cancer Neg Hx    Prostate cancer Neg Hx    Kidney cancer Neg Hx    Sudden Cardiac Death Neg Hx    Arrhythmia Neg Hx    Heart attack Neg Hx     Social History:  reports that he quit smoking about 7 years ago. He has never used smokeless tobacco. He reports that he does not drink alcohol and does not use drugs.  ROS: For  pertinent review of systems please refer to history of present illness  Physical Exam: BP (!) 145/94   Pulse 81   Ht 5\' 10"  (1.778 m)   Wt 228 lb (103.4 kg)   BMI 32.71 kg/m   Constitutional:  Well nourished. Alert and oriented, No acute distress. HEENT: [place] AT, mask in place.  Trachea midline Cardiovascular: No clubbing, cyanosis, or edema. Respiratory: Normal respiratory effort, no increased work of breathing. GU: No CVA tenderness.  No bladder fullness or masses.  Patient with circumcised phallus. Urethral meatus is patent.  No penile discharge. No penile lesions or rashes. Scrotum without lesions, cysts, rashes and/or edema.  Testicles are located scrotally bilaterally. No masses are appreciated in the testicles. Left and right epididymis are normal. Rectal: Patient with  normal sphincter tone. Anus and perineum without scarring or rashes. No rectal masses are appreciated. Prostate is approximately 40 grams, no nodules are appreciated. Seminal vesicles could not be palpated Neurologic: Grossly intact, no focal deficits, moving all 4 extremities. Psychiatric: Normal mood and affect.   Laboratory Data: PSA History  Component     Latest Ref Rng & Units 09/25/2020  Prostate Specific Ag, Serum     0.0 - 4.0 ng/mL 1.0  I have reviewed the labs.  Pertinent Imaging N/A   Assessment & Plan:    1. BPH with LU TS -PSA stable -DRE normal  2. Erectile dysfunction -continue tadalafil 20 mg, on-demand-dosing -refill sent to [facility] to use good Rx    Return in about 1 year (around 10/16/2021) for IPSS, SHIM, PSA and exam.  10/18/2021, [facility]  [facility] [address] [place], [place] [phone]

## 2020-10-16 ENCOUNTER — Other Ambulatory Visit: Payer: Self-pay

## 2020-10-16 ENCOUNTER — Ambulatory Visit (INDEPENDENT_AMBULATORY_CARE_PROVIDER_SITE_OTHER): Payer: Medicare HMO | Admitting: Urology

## 2020-10-16 VITALS — BP 145/94 | HR 81 | Ht 70.0 in | Wt 228.0 lb

## 2020-10-16 DIAGNOSIS — N529 Male erectile dysfunction, unspecified: Secondary | ICD-10-CM

## 2020-10-16 DIAGNOSIS — N401 Enlarged prostate with lower urinary tract symptoms: Secondary | ICD-10-CM

## 2020-10-16 DIAGNOSIS — N138 Other obstructive and reflux uropathy: Secondary | ICD-10-CM | POA: Diagnosis not present

## 2020-10-16 LAB — BLADDER SCAN AMB NON-IMAGING

## 2020-10-16 MED ORDER — TADALAFIL 20 MG PO TABS: 20.0000 mg | ORAL_TABLET | Freq: Every day | ORAL | 6 refills | Status: DC | PRN

## 2020-10-17 ENCOUNTER — Encounter: Payer: Self-pay | Admitting: Urology

## 2020-10-23 DIAGNOSIS — M7062 Trochanteric bursitis, left hip: Secondary | ICD-10-CM | POA: Insufficient documentation

## 2021-03-20 ENCOUNTER — Ambulatory Visit
Admission: EM | Admit: 2021-03-20 | Discharge: 2021-03-20 | Disposition: A | Discharge status: Home / Self Care | Payer: Medicare HMO

## 2021-03-20 ENCOUNTER — Encounter: Payer: Self-pay | Admitting: Emergency Medicine

## 2021-03-20 ENCOUNTER — Other Ambulatory Visit: Payer: Self-pay

## 2021-03-20 DIAGNOSIS — J069 Acute upper respiratory infection, unspecified: Secondary | ICD-10-CM

## 2021-03-20 DIAGNOSIS — I10 Essential (primary) hypertension: Secondary | ICD-10-CM | POA: Diagnosis not present

## 2021-03-20 DIAGNOSIS — R070 Pain in throat: Secondary | ICD-10-CM

## 2021-03-20 DIAGNOSIS — R0982 Postnasal drip: Secondary | ICD-10-CM

## 2021-03-20 MED ORDER — PSEUDOEPHEDRINE HCL 30 MG PO TABS: 30.0000 mg | ORAL_TABLET | Freq: Three times a day (TID) | ORAL | 0 refills | Status: DC | PRN

## 2021-03-20 MED ORDER — CETIRIZINE HCL 10 MG PO TABS: 10.0000 mg | ORAL_TABLET | Freq: Every day | ORAL | 0 refills | Status: AC

## 2021-03-20 MED ORDER — PROMETHAZINE-DM 6.25-15 MG/5ML PO SYRP: 5.0000 mL | ORAL_SOLUTION | Freq: Every evening | ORAL | 0 refills | Status: DC | PRN

## 2021-03-20 MED ORDER — BENZONATATE 100 MG PO CAPS: 100.0000 mg | ORAL_CAPSULE | Freq: Three times a day (TID) | ORAL | 0 refills | Status: DC | PRN

## 2021-03-20 NOTE — Discharge Instructions (Signed)
We will manage this as a viral upper respiratory illness. For sore throat or cough try using a honey-based tea. Use 3 teaspoons of honey with juice squeezed from half lemon. Place shaved pieces of ginger into 1/2-1 cup of water and warm over stove top. Then mix the ingredients and repeat every 4 hours as needed. Please take Tylenol 500mg -650mg  once every 6 hours for fevers, aches and pains. Hydrate very well with at least 2 liters (64 ounces) of water. Eat light meals such as soups (chicken and noodles, chicken wild rice, vegetable).  Do not eat any foods that you are allergic to.  Start an antihistamine like Zyrtec for postnasal drainage, sinus congestion.  You can take this together with pseudoephedrine (Sudafed) at a dose of 30 mg 3 times a day or twice daily as needed for the same kind of congestion.  Use the cough medications as needed.

## 2021-03-20 NOTE — ED Provider Notes (Signed)
Elijah Wagner   MRN: [id number] DOB: [birth year]  Subjective:   Elijah Wagner is a 65 y.o. male presenting for 2-day history of acute onset persistent coughing, throat pain, runny nose, malaise and fatigue.  Patient did a COVID test at home and was negative.  Does not want repeat test here.  No body aches, chest pain, shortness of breath or wheezing.  No history of respiratory disorders.  Patient is not smoking.  He does have a history of hypertension and is compliant with his medication.  No current facility-administered medications for this encounter.  Current Outpatient Medications:    amLODipine (NORVASC) 10 MG tablet, Take 10 mg by mouth daily., Disp: , Rfl:    aspirin 81 MG EC tablet, Take by mouth., Disp: , Rfl:    celecoxib (CELEBREX) 200 MG capsule, Take 200 mg by mouth 2 (two) times daily., Disp: , Rfl:    tadalafil (CIALIS) 20 MG tablet, Take 1 tablet (20 mg total) by mouth daily as needed for erectile dysfunction., Disp: 30 tablet, Rfl: 6   traMADol (ULTRAM) 50 MG tablet, Take 50 mg by mouth every 6 (six) hours as needed., Disp: , Rfl:    No Known Allergies  Past Medical History:  Diagnosis Date   Hypertension      Past Surgical History:  Procedure Laterality Date   HYDROCELE EXCISION / REPAIR     TONSILLECTOMY      Family History  Problem Relation Age of Onset   Leukemia Father    Sarcoidosis Mother    Diabetes Mother    Bladder Cancer Neg Hx    Prostate cancer Neg Hx    Kidney cancer Neg Hx    Sudden Cardiac Death Neg Hx    Arrhythmia Neg Hx    Heart attack Neg Hx     Social History   Tobacco Use   Smoking status: Former    Types: Cigarettes    Quit date: 2015    Years since quitting: 7.9   Smokeless tobacco: Never  Vaping Use   Vaping Use: Never used  Substance Use Topics   Alcohol use: No   Drug use: No    ROS   Objective:   Vitals: BP 132/90 (BP Location: Left Wrist)    Pulse 73    Temp 98.7 F (37.1 C) (Oral)    Resp 16     SpO2 95%   Physical Exam Constitutional:      General: He is not in acute distress.    Appearance: Normal appearance. He is well-developed and normal weight. He is not ill-appearing, toxic-appearing or diaphoretic.  HENT:     Head: Normocephalic and atraumatic.     Right Ear: Tympanic membrane, ear canal and external ear normal. There is no impacted cerumen.     Left Ear: Tympanic membrane, ear canal and external ear normal. There is no impacted cerumen.     Nose: Nose normal. No congestion or rhinorrhea.     Mouth/Throat:     Mouth: Mucous membranes are moist.     Pharynx: No pharyngeal swelling, oropharyngeal exudate, posterior oropharyngeal erythema or uvula swelling.     Comments: Thick streaks of post-nasal drainage overlying pharynx.  Eyes:     General: No scleral icterus.       Right eye: No discharge.        Left eye: No discharge.     Extraocular Movements: Extraocular movements intact.     Conjunctiva/sclera: Conjunctivae normal.     Pupils:  Pupils are equal, round, and reactive to light.  Cardiovascular:     Rate and Rhythm: Normal rate and regular rhythm.     Heart sounds: Normal heart sounds. No murmur heard.   No friction rub. No gallop.  Pulmonary:     Effort: Pulmonary effort is normal. No respiratory distress.     Breath sounds: Normal breath sounds. No stridor. No wheezing, rhonchi or rales.  Musculoskeletal:     Cervical back: Normal range of motion and neck supple. No rigidity. No muscular tenderness.  Neurological:     General: No focal deficit present.     Mental Status: He is alert and oriented to person, place, and time.  Psychiatric:        Mood and Affect: Mood normal.        Behavior: Behavior normal.        Thought Content: Thought content normal.    Assessment and Plan :   PDMP not reviewed this encounter.  1. Viral URI with cough   2. Post-nasal drainage   3. Throat pain   4. Essential hypertension     Patient refused testing.   Deferred imaging given clear cardiopulmonary exam, hemodynamically stable vital signs. Suspect viral URI, viral syndrome. Physical exam findings reassuring and vital signs stable for discharge. Advised supportive care, offered symptomatic relief. Counseled patient on potential for adverse effects with medications prescribed/recommended today, ER and return-to-clinic precautions discussed, patient verbalized understanding.     Wallis Bamberg, [name] 03/20/21 1357

## 2021-03-20 NOTE — ED Triage Notes (Signed)
Pt c/o cough, runny nose, watery eyes and ST x 2 days

## 2021-07-24 ENCOUNTER — Ambulatory Visit: Payer: Medicare HMO | Admitting: Dermatology

## 2021-07-28 ENCOUNTER — Ambulatory Visit: Payer: Medicare HMO | Admitting: Dermatology

## 2021-07-28 ENCOUNTER — Encounter: Payer: Self-pay | Admitting: Dermatology

## 2021-07-28 DIAGNOSIS — L821 Other seborrheic keratosis: Secondary | ICD-10-CM

## 2021-07-28 DIAGNOSIS — L738 Other specified follicular disorders: Secondary | ICD-10-CM

## 2021-07-28 DIAGNOSIS — L810 Postinflammatory hyperpigmentation: Secondary | ICD-10-CM

## 2021-07-28 NOTE — Progress Notes (Signed)
   New Patient Visit  Subjective  Elijah Wagner is a 66 y.o. male who presents for the following: Rash (Dur: 3 weeks. Has used Rx medication Dr. Laurence Ferrari gave him a few years ago for the back of his neck. Denies itching, non tender. C/O discoloration).  Objective  Well appearing patient in no apparent distress; mood and affect are within normal limits.  Review of Systems: No other skin or systemic complaints except as noted in HPI or Assessment and Plan.  A focused examination was performed including head, including the scalp, face, neck, nose, ears, eyelids, and lips. Relevant physical exam findings are noted in the Assessment and Plan.  Scalp Hyperpigmented macules at bilateral frontal scalp          Assessment & Plan  Pseudofolliculitis Scalp With associated postinflammatory hyperpigmentation secondary to shaving.  Color may normalize on its own over time. Could take several months to a year.  Neutrogena Dry Touch Mineral sunscreen samples given.   Seborrheic Keratoses - face - Stuck-on, waxy, tan-brown papules and/or plaques  - Benign-appearing - Discussed benign etiology and prognosis. - Observe - Call for any changes  Return if symptoms worsen or fail to improve.  I, Emelia Salisbury, CMA, am acting as scribe for Sarina Ser, MD. Documentation: I have reviewed the above documentation for accuracy and completeness, and I agree with the above.  Sarina Ser, MD

## 2021-07-28 NOTE — Patient Instructions (Addendum)
Recommend daily broad spectrum sunscreen SPF 30+ to sun-exposed areas, reapply every 2 hours as needed. Call for new or changing lesions.  °Staying in the shade or wearing long sleeves, sun glasses (UVA+UVB protection) and wide brim hats (4-inch brim around the entire circumference of the hat) are also recommended for sun protection.  ° ° ° °If You Need Anything After Your Visit ° °If you have any questions or concerns for your doctor, please call our main line at [phone] and press option 4 to reach your doctor's medical assistant. If no one answers, please leave a voicemail as directed and we will return your call as soon as possible. Messages left after 4 pm will be answered the following business day.  ° °You may also send us a message via MyChart. We typically respond to MyChart messages within 1-2 business days. ° °For prescription refills, please ask your pharmacy to contact our office. Our fax number is [phone]. ° °If you have an urgent issue when the clinic is closed that cannot wait until the next business day, you can page your doctor at the number below.   ° °Please note that while we do our best to be available for urgent issues outside of office hours, we are not available 24/7.  ° °If you have an urgent issue and are unable to reach us, you may choose to seek medical care at your doctor's office, retail clinic, urgent care center, or emergency room. ° °If you have a medical emergency, please immediately call 911 or go to the emergency department. ° °Pager Numbers ° °- Dr. Kowalski: [phone] ° °- Dr. Moye: [phone] ° °- Dr. Stewart: [phone] ° °In the event of inclement weather, please call our main line at [phone] for an update on the status of any delays or closures. ° °Dermatology Medication Tips: °Please keep the boxes that topical medications come in in order to help keep track of the instructions about where and how to use these. Pharmacies typically print the medication  instructions only on the boxes and not directly on the medication tubes.  ° °If your medication is too expensive, please contact our office at [phone] option 4 or send us a message through MyChart.  ° °We are unable to tell what your co-pay for medications will be in advance as this is different depending on your insurance coverage. However, we may be able to find a substitute medication at lower cost or fill out paperwork to get insurance to cover a needed medication.  ° °If a prior authorization is required to get your medication covered by your insurance company, please allow us 1-2 business days to complete this process. ° °Drug prices often vary depending on where the prescription is filled and some pharmacies may offer cheaper prices. ° °The website www.goodrx.com contains coupons for medications through different pharmacies. The prices here do not account for what the cost may be with help from insurance (it may be cheaper with your insurance), but the website can give you the price if you did not use any insurance.  °- You can print the associated coupon and take it with your prescription to the pharmacy.  °- You may also stop by our office during regular business hours and pick up a GoodRx coupon card.  °- If you need your prescription sent electronically to a different pharmacy, notify our office through [facility] MyChart or by phone at [phone] option 4. ° ° ° ° °Si Usted Necesita Algo Después de   Su Visita ° °También puede enviarnos un mensaje a través de MyChart. Por lo general respondemos a los mensajes de MyChart en el transcurso de 1 a 2 días hábiles. ° °Para renovar recetas, por favor pida a su farmacia que se ponga en contacto con nuestra oficina. Nuestro número de fax es el [phone]. ° °Si tiene un asunto urgente cuando la clínica esté cerrada y que no puede esperar hasta el siguiente día hábil, puede llamar/localizar a su doctor(a) al número que aparece a continuación.  ° °Por  favor, tenga en cuenta que aunque hacemos todo lo posible para estar disponibles para asuntos urgentes fuera del horario de oficina, no estamos disponibles las 24 horas del día, los 7 días de la semana.  ° °Si tiene un problema urgente y no puede comunicarse con nosotros, puede optar por buscar atención médica  en el consultorio de su doctor(a), en una clínica privada, en un centro de atención urgente o en una sala de emergencias. ° °Si tiene una emergencia médica, por favor llame inmediatamente al 911 o vaya a la sala de emergencias. ° °Números de bíper ° °- Dr. Kowalski: [phone] ° °- Dra. Moye: [phone] ° °- Dra. Stewart: [phone] ° °En caso de inclemencias del tiempo, por favor llame a nuestra línea principal al [phone] para una actualización sobre el estado de cualquier retraso o cierre. ° °Consejos para la medicación en dermatología: °Por favor, guarde las cajas en las que vienen los medicamentos de uso tópico para ayudarle a seguir las instrucciones sobre dónde y cómo usarlos. Las farmacias generalmente imprimen las instrucciones del medicamento sólo en las cajas y no directamente en los tubos del medicamento.  ° °Si su medicamento es muy caro, por favor, póngase en contacto con nuestra oficina llamando al [phone] y presione la opción 4 o envíenos un mensaje a través de MyChart.  ° °No podemos decirle cuál será su copago por los medicamentos por adelantado ya que esto es diferente dependiendo de la cobertura de su seguro. Sin embargo, es posible que podamos encontrar un medicamento sustituto a menor costo o llenar un formulario para que el seguro cubra el medicamento que se considera necesario.  ° °Si se requiere una autorización previa para que su compañía de seguros cubra su medicamento, por favor permítanos de 1 a 2 días hábiles para completar este proceso. ° °Los precios de los medicamentos varían con frecuencia dependiendo del lugar de dónde se surte la receta y alguna farmacias  pueden ofrecer precios más baratos. ° °El sitio web www.goodrx.com tiene cupones para medicamentos de diferentes farmacias. Los precios aquí no tienen en cuenta lo que podría costar con la ayuda del seguro (puede ser más barato con su seguro), pero el sitio web puede darle el precio si no utilizó ningún seguro.  °- Puede imprimir el cupón correspondiente y llevarlo con su receta a la farmacia.  °- También puede pasar por nuestra oficina durante el horario de atención regular y recoger una tarjeta de cupones de GoodRx.  °- Si necesita que su receta se envíe electrónicamente a una farmacia diferente, informe a nuestra oficina a través de MyChart de [facility] o por teléfono llamando al [phone] y presione la opción 4. ° °

## 2021-08-12 ENCOUNTER — Encounter: Payer: Self-pay | Admitting: Dermatology

## 2021-10-10 ENCOUNTER — Other Ambulatory Visit: Payer: Self-pay

## 2021-10-10 DIAGNOSIS — N138 Other obstructive and reflux uropathy: Secondary | ICD-10-CM

## 2021-10-14 ENCOUNTER — Other Ambulatory Visit: Payer: Medicare HMO

## 2021-10-16 ENCOUNTER — Ambulatory Visit: Payer: Medicare HMO | Admitting: Urology

## 2021-10-21 ENCOUNTER — Other Ambulatory Visit: Payer: Medicare HMO

## 2021-10-22 ENCOUNTER — Other Ambulatory Visit: Payer: Medicare HMO

## 2021-10-22 NOTE — Progress Notes (Unsigned)
10/23/2021 9:01 AM   Elijah Wagner 1955-04-11 [id number]  Referring provider: Armando Gang, FNP [address] [place],  [place] [address]  Urological history: 1. BPH with LU TS -PSA pending -I PSS 1/1 -PVR 0 mL  2. Family history of prostate -brother with ? Prostate cancer  3. ED -contributing factors of age, BPH, former smoker and HTN -SHIM 22 -tadalafil 20 mg, on-demand-dosing   Chief Complaint  Patient presents with   Benign Prostatic Hypertrophy   Erectile Dysfunction   HPI: Elijah Wagner is a 66 y.o.  who presents today for a one year follow up.  He has no urinary complaints at this time.  Nocturia x 1.  Patient denies any modifying or aggravating factors.  Patient denies any gross hematuria, dysuria or suprapubic/flank pain.  Patient denies any fevers, chills, nausea or vomiting.     IPSS     Row Name 10/23/21 0800         International Prostate Symptom Score   How often have you had the sensation of not emptying your bladder? Not at All     How often have you had to urinate less than every two hours? Not at All     How often have you found you stopped and started again several times when you urinated? Not at All     How often have you found it difficult to postpone urination? Not at All     How often have you had a weak urinary stream? Not at All     How often have you had to strain to start urination? Not at All     How many times did you typically get up at night to urinate? 1 Time     Total IPSS Score 1       Quality of Life due to urinary symptoms   If you were to spend the rest of your life with your urinary condition just the way it is now how would you feel about that? Pleased                Score:  1-7 Mild 8-19 Moderate 20-35 Severe   Patient still having spontaneous erections.  He denies any pain or curvature with erections.   He is having a sub-optimal response to the tadalafil 20 mg on-demand-dosing.   SHIM     Row  Name 10/23/21 0844         SHIM: Over the last 6 months:   How do you rate your confidence that you could get and keep an erection? High     When you had erections with sexual stimulation, how often were your erections hard enough for penetration (entering your partner)? Most Times (much more than half the time)     During sexual intercourse, how often were you able to maintain your erection after you had penetrated (entered) your partner? Most Times (much more than half the time)     During sexual intercourse, how difficult was it to maintain your erection to completion of intercourse? Not Difficult     When you attempted sexual intercourse, how often was it satisfactory for you? Almost Always or Always       SHIM Total Score   SHIM 22                Score: 1-7 Severe ED 8-11 Moderate ED 12-16 Mild-Moderate ED 17-21 Mild ED 22-25 No ED   PMH: Past Medical History:  Diagnosis Date  Hypertension     Surgical History: Past Surgical History:  Procedure Laterality Date   HYDROCELE EXCISION / REPAIR     TONSILLECTOMY      Home Medications:  Allergies as of 10/23/2021   No Known Allergies      Medication List        Accurate as of October 23, 2021  9:01 AM. If you have any questions, ask your nurse or doctor.          STOP taking these medications    tadalafil 20 MG tablet Commonly known as: CIALIS       TAKE these medications    amLODipine 10 MG tablet Commonly known as: NORVASC Take 10 mg by mouth daily.   aspirin EC 81 MG tablet Take by mouth.   benzonatate 100 MG capsule Commonly known as: TESSALON Take 1-2 capsules (100-200 mg total) by mouth 3 (three) times daily as needed for cough.   celecoxib 200 MG capsule Commonly known as: CELEBREX Take 200 mg by mouth 2 (two) times daily.   cetirizine 10 MG tablet Commonly known as: ZyrTEC Allergy Take 1 tablet (10 mg total) by mouth daily.   promethazine-dextromethorphan 6.25-15 MG/5ML  syrup Commonly known as: PROMETHAZINE-DM Take 5 mLs by mouth at bedtime as needed for cough.   pseudoephedrine 30 MG tablet Commonly known as: SUDAFED Take 1 tablet (30 mg total) by mouth every 8 (eight) hours as needed for congestion.   sildenafil 100 MG tablet Commonly known as: VIAGRA Take 1 tablet (100 mg total) by mouth daily as needed for erectile dysfunction.   traMADol 50 MG tablet Commonly known as: ULTRAM Take 50 mg by mouth every 6 (six) hours as needed.        Allergies: No Known Allergies  Family History: Family History  Problem Relation Age of Onset   Leukemia Father    Sarcoidosis Mother    Diabetes Mother    Bladder Cancer Neg Hx    Prostate cancer Neg Hx    Kidney cancer Neg Hx    Sudden Cardiac Death Neg Hx    Arrhythmia Neg Hx    Heart attack Neg Hx     Social History:  reports that he quit smoking about 8 years ago. His smoking use included cigarettes. He has never used smokeless tobacco. He reports that he does not drink alcohol and does not use drugs.  ROS: For pertinent review of systems please refer to history of present illness  Physical Exam: BP (!) 143/91   Pulse 70   Ht 5\' 10"  (1.778 m)   Wt 215 lb (97.5 kg)   BMI 30.85 kg/m   Constitutional:  Well nourished. Alert and oriented, No acute distress. HEENT: [place] AT, moist mucus membranes.  Trachea midline Cardiovascular: No clubbing, cyanosis, or edema. Respiratory: Normal respiratory effort, no increased work of breathing. GU: No CVA tenderness.  No bladder fullness or masses.  Patient with circumcised phallus. Urethral meatus is patent.  No penile discharge. No penile lesions or rashes. Scrotum without lesions, cysts, rashes and/or edema.  Testicles are located scrotally bilaterally. No masses are appreciated in the testicles. Left and right epididymis are normal. Rectal: Patient with  normal sphincter tone. Anus and perineum without scarring or rashes. No rectal masses are appreciated.  Prostate is approximately 45 grams, deep medium sulcus, could only palpate apex and midportion of the gland, no nodules are appreciated. Seminal vesicles could not be palpated.   Neurologic: Grossly intact, no focal deficits, moving  all 4 extremities. Psychiatric: Normal mood and affect.   Laboratory Data: PSA pending  Pertinent Imaging N/A   Assessment & Plan:    1. BPH with LUTS -PSA pending -DRE benign -continue conservative management, avoiding bladder irritants and timed voiding's   2. Erectile dysfunction -Suboptimal response to tadalafil -discontinue tadalafil 20 mg, on-demand-dosing -trial of sildenafil 100 mg, on-demand-dosing   Return in about 1 year (around 10/24/2022) for IPSS, SHIM, PSA and exam.  Michiel Cowboy, [facility]  [facility] [address] [place], [place] [address] [phone]

## 2021-10-23 ENCOUNTER — Encounter: Payer: Self-pay | Admitting: Urology

## 2021-10-23 ENCOUNTER — Ambulatory Visit: Payer: Medicare HMO | Admitting: Urology

## 2021-10-23 VITALS — BP 143/91 | HR 70 | Ht 70.0 in | Wt 215.0 lb

## 2021-10-23 DIAGNOSIS — N529 Male erectile dysfunction, unspecified: Secondary | ICD-10-CM | POA: Diagnosis not present

## 2021-10-23 DIAGNOSIS — N138 Other obstructive and reflux uropathy: Secondary | ICD-10-CM

## 2021-10-23 DIAGNOSIS — N401 Enlarged prostate with lower urinary tract symptoms: Secondary | ICD-10-CM | POA: Diagnosis not present

## 2021-10-23 MED ORDER — SILDENAFIL CITRATE 100 MG PO TABS: 100.0000 mg | ORAL_TABLET | Freq: Every day | ORAL | 1 refills | Status: DC | PRN

## 2021-10-24 ENCOUNTER — Telehealth: Payer: Self-pay

## 2021-10-24 LAB — PSA: Prostate Specific Ag, Serum: 1.3 ng/mL (ref 0.0–4.0)

## 2021-10-24 NOTE — Telephone Encounter (Signed)
-----   Message from Harle Battiest, [name] sent at 10/24/2021  7:49 AM EDT ----- Please let [name] know that his PSA is stable at 1.3 and we will see him next year.

## 2021-10-24 NOTE — Telephone Encounter (Signed)
Notified pt as advised, pt expressed understanding.  ?

## 2022-02-08 IMAGING — CR DG CHEST 2V
1 series · 2 of 2 positions shown · non-contrast
Comparison: 10/10/2016 chest radiograph

CLINICAL DATA: Cough for 1 week.

EXAM:
CHEST - 2 VIEW

[Series 2: w chest pa · 0.14mm/px · 2 of 2 slices shown]
[im 1/2]
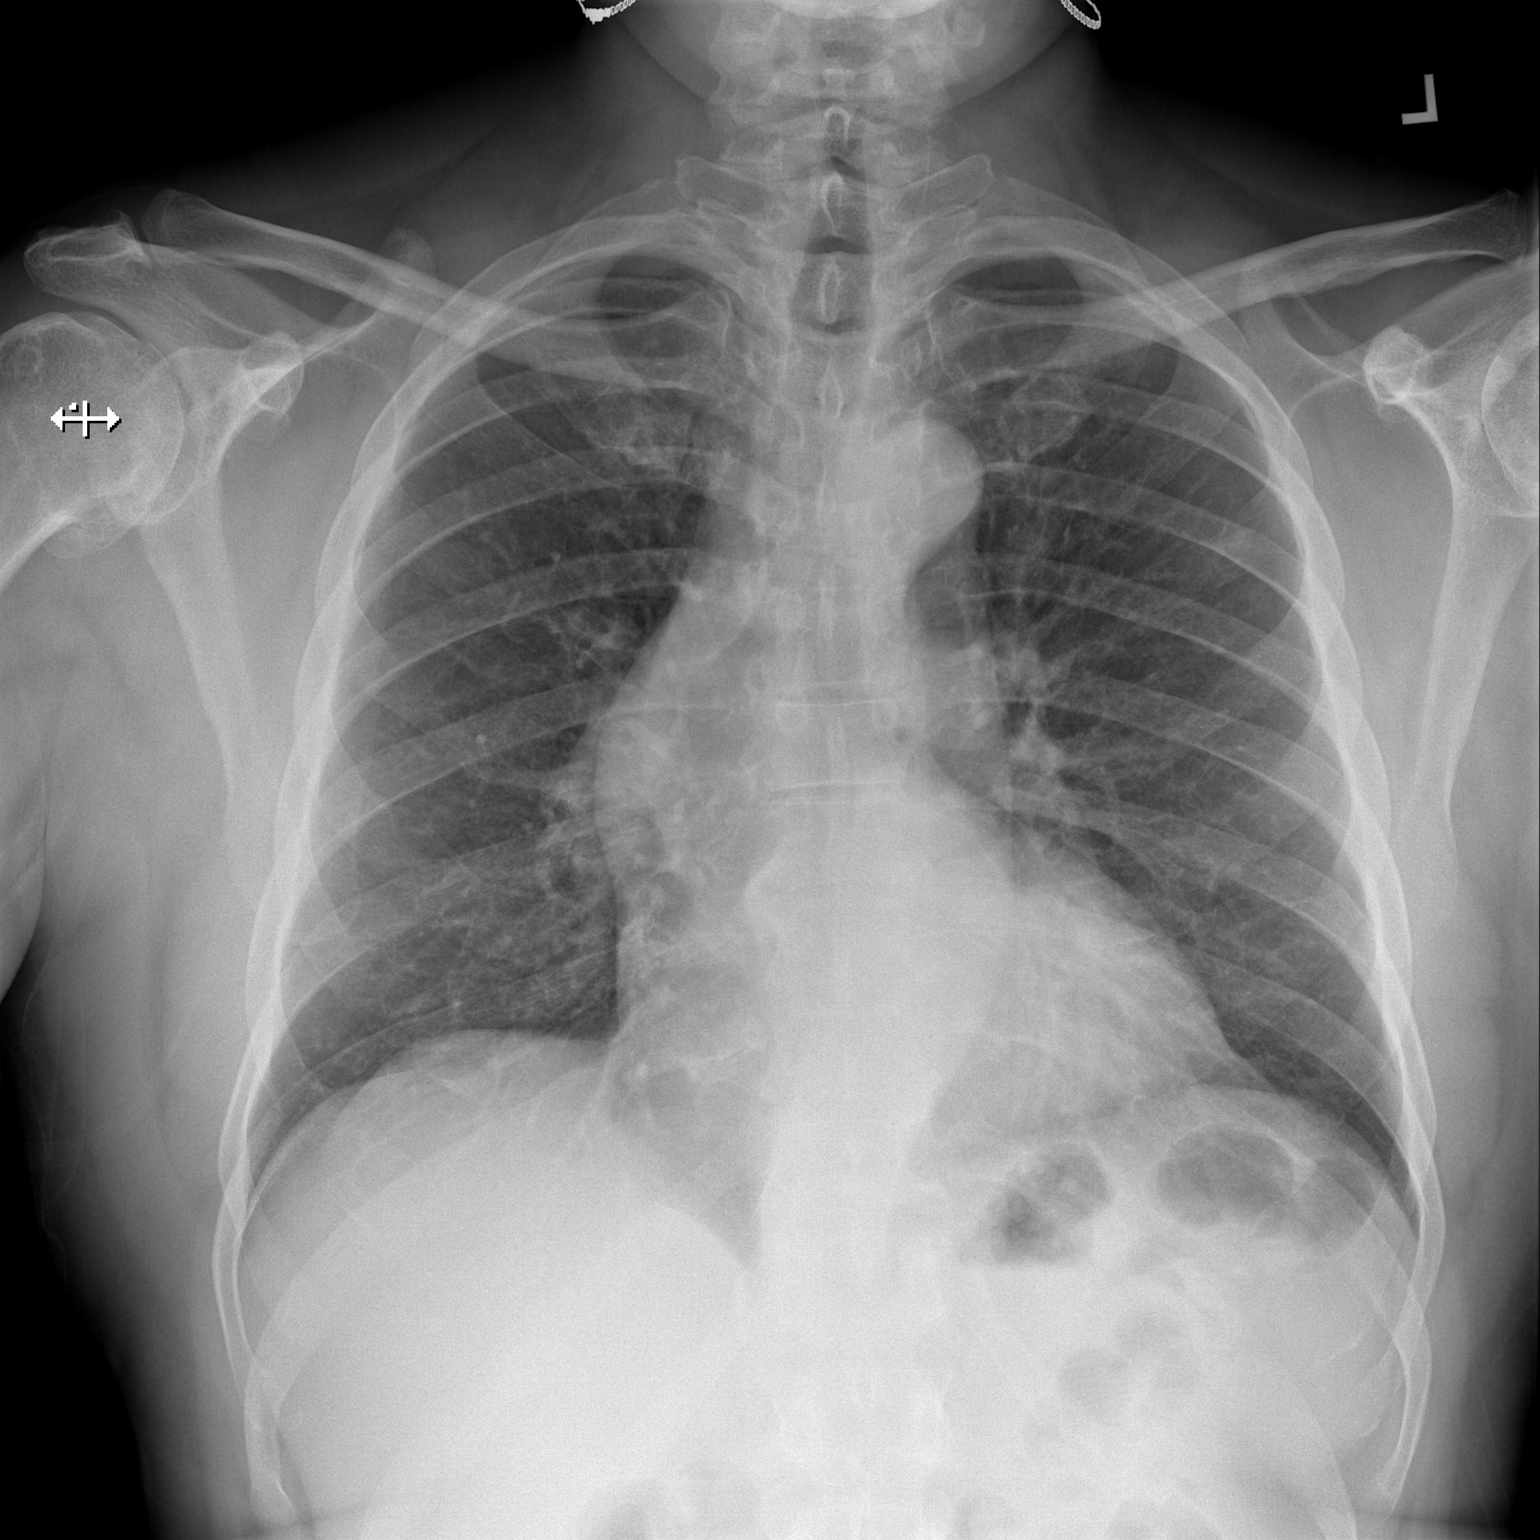
[im 2/2]
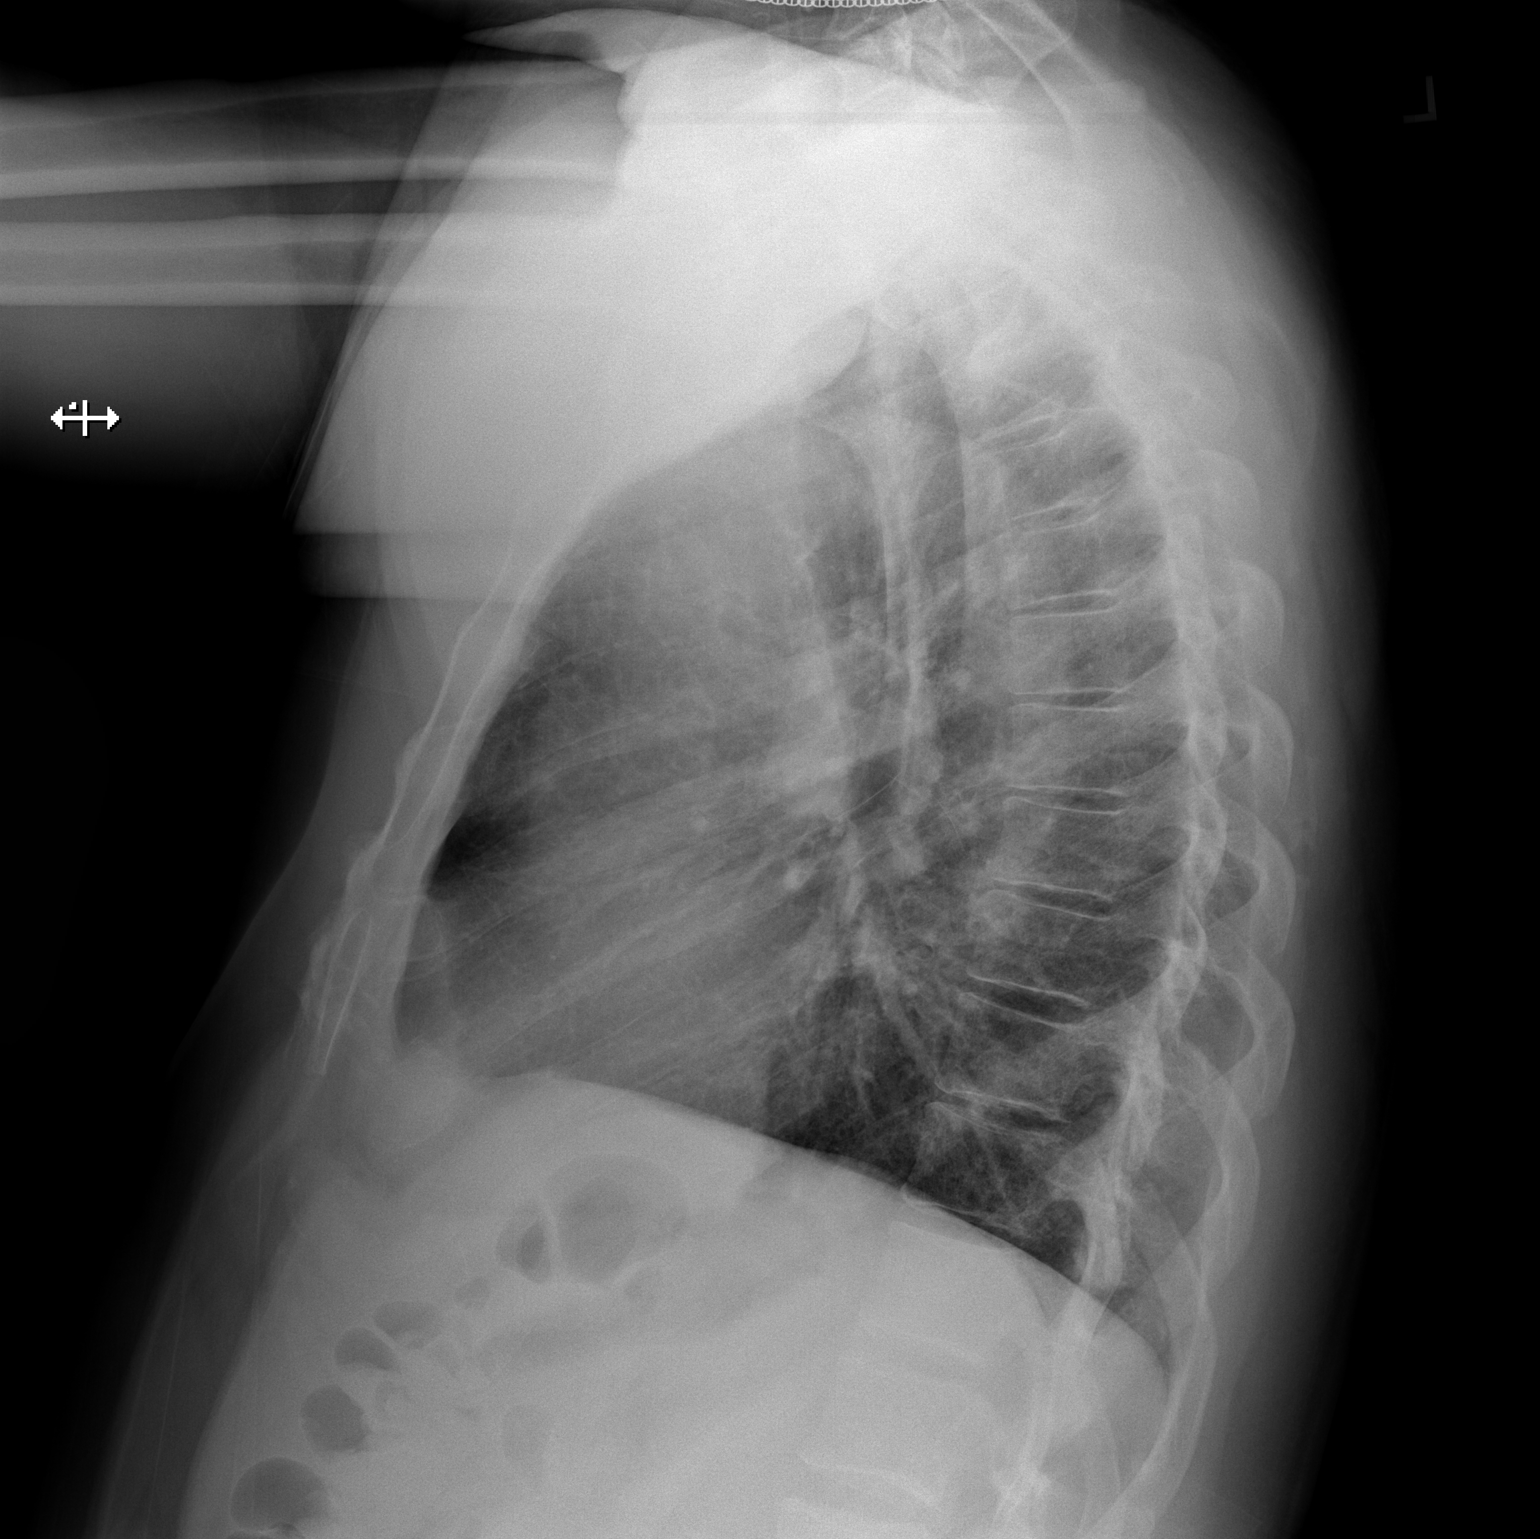

[2 of 2 positions shown; findings below may reference images not displayed]

FINDINGS: Cardiomediastinal silhouette is unchanged with possible
ectatic/tortuous ascending thoracic aorta.

There is no evidence of focal airspace disease, pulmonary edema,
suspicious pulmonary nodule/mass, pleural effusion, or pneumothorax.

No acute bony abnormalities are identified.
IMPRESSION: No active cardiopulmonary disease.

## 2022-04-07 ENCOUNTER — Ambulatory Visit: Payer: Self-pay

## 2022-10-19 ENCOUNTER — Other Ambulatory Visit: Payer: Self-pay

## 2022-10-19 DIAGNOSIS — N401 Enlarged prostate with lower urinary tract symptoms: Secondary | ICD-10-CM

## 2022-10-20 ENCOUNTER — Other Ambulatory Visit: Payer: Medicare Other

## 2022-10-20 DIAGNOSIS — N138 Other obstructive and reflux uropathy: Secondary | ICD-10-CM

## 2022-10-21 NOTE — Progress Notes (Unsigned)
10/22/2022 11:49 AM   Elijah Wagner 08-01-1955 [id number]  Referring provider: Armando Gang, FNP [address] [place],  [place] [address]  Urological history: 1. BPH with LU TS -PSA (09/2022) 1.1   2. Family history of prostate -brother with ? Prostate cancer   3. ED -contributing factors of age, BPH, former smoker and HTN -Sildenafil 100 mg, on-demand dosing  No chief complaint on file.  HPI: Elijah Wagner is a 67 y.o. male who presents today for one year follow up.   Previous records reviewed.   I PSS ***    Score:  1-7 Mild 8-19 Moderate 20-35 Severe    SHIM ***    Score: 1-7 Severe ED 8-11 Moderate ED 12-16 Mild-Moderate ED 17-21 Mild ED 22-25 No ED   PMH: Past Medical History:  Diagnosis Date   Hypertension     Surgical History: Past Surgical History:  Procedure Laterality Date   HYDROCELE EXCISION / REPAIR     TONSILLECTOMY      Home Medications:  Allergies as of 10/22/2022   No Known Allergies      Medication List        Accurate as of October 21, 2022 11:49 AM. If you have any questions, ask your nurse or doctor.          amLODipine 10 MG tablet Commonly known as: NORVASC Take 10 mg by mouth daily.   aspirin EC 81 MG tablet Take by mouth.   benzonatate 100 MG capsule Commonly known as: TESSALON Take 1-2 capsules (100-200 mg total) by mouth 3 (three) times daily as needed for cough.   celecoxib 200 MG capsule Commonly known as: CELEBREX Take 200 mg by mouth 2 (two) times daily.   cetirizine 10 MG tablet Commonly known as: ZyrTEC Allergy Take 1 tablet (10 mg total) by mouth daily.   promethazine-dextromethorphan 6.25-15 MG/5ML syrup Commonly known as: PROMETHAZINE-DM Take 5 mLs by mouth at bedtime as needed for cough.   pseudoephedrine 30 MG tablet Commonly known as: SUDAFED Take 1 tablet (30 mg total) by mouth every 8 (eight) hours as needed for congestion.   sildenafil 100 MG tablet Commonly  known as: VIAGRA Take 1 tablet (100 mg total) by mouth daily as needed for erectile dysfunction.   traMADol 50 MG tablet Commonly known as: ULTRAM Take 50 mg by mouth every 6 (six) hours as needed.        Allergies: No Known Allergies  Family History: Family History  Problem Relation Age of Onset   Leukemia Father    Sarcoidosis Mother    Diabetes Mother    Bladder Cancer Neg Hx    Prostate cancer Neg Hx    Kidney cancer Neg Hx    Sudden Cardiac Death Neg Hx    Arrhythmia Neg Hx    Heart attack Neg Hx     Social History:  reports that he quit smoking about 9 years ago. His smoking use included cigarettes. He has never used smokeless tobacco. He reports that he does not drink alcohol and does not use drugs.  ROS: Pertinent ROS in HPI  Physical Exam: There were no vitals taken for this visit.  Constitutional:  Well nourished. Alert and oriented, No acute distress. HEENT: [place] AT, moist mucus membranes.  Trachea midline, no masses. Cardiovascular: No clubbing, cyanosis, or edema. Respiratory: Normal respiratory effort, no increased work of breathing. GI: Abdomen is soft, non tender, non distended, no abdominal masses. Liver and spleen not palpable.  No  hernias appreciated.  Stool sample for occult testing is not indicated.   GU: No CVA tenderness.  No bladder fullness or masses.  Patient with circumcised/uncircumcised phallus. ***Foreskin easily retracted***  Urethral meatus is patent.  No penile discharge. No penile lesions or rashes. Scrotum without lesions, cysts, rashes and/or edema.  Testicles are located scrotally bilaterally. No masses are appreciated in the testicles. Left and right epididymis are normal. Rectal: Patient with  normal sphincter tone. Anus and perineum without scarring or rashes. No rectal masses are appreciated. Prostate is approximately *** grams, *** nodules are appreciated. Seminal vesicles are normal. Skin: No rashes, bruises or suspicious  lesions. Lymph: No cervical or inguinal adenopathy. Neurologic: Grossly intact, no focal deficits, moving all 4 extremities. Psychiatric: Normal mood and affect.  Laboratory Data: Results for orders placed or performed in visit on 10/20/22  PSA  Result Value Ref Range   Prostate Specific Ag, Serum 1.1 0.0 - 4.0 ng/mL  I have reviewed the labs.   Pertinent Imaging: N/A  Assessment & Plan:  ***  1. BPH with LUTS -PSA stable *** -DRE benign *** -UA benign *** -PVR < 300 cc *** -symptoms - *** -most bothersome symptoms are *** -continue conservative management, avoiding bladder irritants and timed voiding's -Initiate alpha-blocker (***), discussed side effects *** -Initiate 5 alpha reductase inhibitor (***), discussed side effects *** -Continue tamsulosin 0.4 mg daily, alfuzosin 10 mg daily, Rapaflo 8 mg daily, terazosin, doxazosin, Cialis 5 mg daily and finasteride 5 mg daily, dutasteride 0.5 mg daily***:refills given -Cannot tolerate medication or medication failure, schedule cystoscopy ***  2. Erectile dysfunction - I explained to the patient that in order to achieve an erection it takes good functioning of the nervous system (parasympathetic and rs, sympathetic, sensory and motor), good blood flow into the erectile tissue of the penis and a desire to have sex - I explained that conditions like diabetes, hypertension, coronary artery disease, peripheral vascular disease, smoking, alcohol consumption, age, sleep apnea and BPH can diminish the ability to have an erection - I explained the ED may be a risk marker for underlying CVD and he should follow up with PCP for further studies *** - we will obtain a serum testosterone level at this time; if it is abnormal we will need to repeat the study for confirmation *** - A recent study published in Sex Med 2018 Apr 13 revealed moderate to vigorous aerobic exercise for 40 minutes 4 times per week can decrease erectile problems caused by  physical inactivity, obesity, hypertension, metabolic syndrome and/or cardiovascular diseases *** - We discussed trying a *** different PDE5 inhibitor, intra-urethral suppositories, intracavernous vasoactive drug injection therapy, vacuum erection devices, LI-ESWT and penile prosthesis implantation    No follow-ups on file.  These notes generated with voice recognition software. I apologize for typographical errors.  Cloretta Ned  [facility] [address]  [address] [place], [place] [address] [phone]

## 2022-10-22 ENCOUNTER — Ambulatory Visit: Payer: Medicare HMO | Admitting: Urology

## 2022-10-22 ENCOUNTER — Ambulatory Visit: Payer: Medicare Other | Admitting: Urology

## 2022-10-22 ENCOUNTER — Encounter: Payer: Self-pay | Admitting: Urology

## 2022-10-22 VITALS — BP 122/83 | HR 71 | Wt 225.0 lb

## 2022-10-22 DIAGNOSIS — E291 Testicular hypofunction: Secondary | ICD-10-CM

## 2022-10-22 DIAGNOSIS — N138 Other obstructive and reflux uropathy: Secondary | ICD-10-CM

## 2022-10-22 DIAGNOSIS — N529 Male erectile dysfunction, unspecified: Secondary | ICD-10-CM | POA: Diagnosis not present

## 2022-10-22 DIAGNOSIS — N401 Enlarged prostate with lower urinary tract symptoms: Secondary | ICD-10-CM

## 2022-10-22 MED ORDER — SILDENAFIL CITRATE 100 MG PO TABS: 100.0000 mg | ORAL_TABLET | Freq: Every day | ORAL | 1 refills | Status: DC | PRN

## 2022-10-22 NOTE — Addendum Note (Signed)
Addended by: Tilden Dome on: 10/22/2022 11:22 AM   Modules accepted: Orders

## 2022-11-18 ENCOUNTER — Ambulatory Visit: Payer: Medicare Other

## 2022-11-18 DIAGNOSIS — Z1211 Encounter for screening for malignant neoplasm of colon: Secondary | ICD-10-CM | POA: Diagnosis present

## 2022-11-18 DIAGNOSIS — Z86010 Personal history of colonic polyps: Secondary | ICD-10-CM | POA: Diagnosis not present

## 2022-11-18 DIAGNOSIS — K640 First degree hemorrhoids: Secondary | ICD-10-CM | POA: Diagnosis not present

## 2022-11-18 DIAGNOSIS — D122 Benign neoplasm of ascending colon: Secondary | ICD-10-CM | POA: Diagnosis not present

## 2022-11-18 DIAGNOSIS — K635 Polyp of colon: Secondary | ICD-10-CM | POA: Diagnosis not present

## 2022-11-24 ENCOUNTER — Other Ambulatory Visit: Payer: Self-pay | Admitting: *Deleted

## 2022-11-24 DIAGNOSIS — E291 Testicular hypofunction: Secondary | ICD-10-CM

## 2022-11-25 ENCOUNTER — Other Ambulatory Visit: Payer: Medicare Other

## 2022-11-30 ENCOUNTER — Other Ambulatory Visit: Payer: Medicare Other

## 2022-12-04 ENCOUNTER — Other Ambulatory Visit: Payer: Medicare Other

## 2022-12-04 DIAGNOSIS — E291 Testicular hypofunction: Secondary | ICD-10-CM

## 2022-12-05 LAB — TESTOSTERONE: Testosterone: 306 ng/dL (ref 264–916)

## 2022-12-08 ENCOUNTER — Telehealth: Payer: Self-pay | Admitting: *Deleted

## 2022-12-08 NOTE — Telephone Encounter (Signed)
Marland Kitchen  left message to have patient return my call.

## 2022-12-08 NOTE — Telephone Encounter (Signed)
-----   Message from [facility] sent at 12/07/2022  7:06 PM EDT ----- Please let Elijah Wagner know that his testosterone level has returned low normal.   While it may be contributing to your decrease in libido, insurance will not cover any testosterone therapy unless the testosterone is below lab normals.  In these situations, we offer men Clomid.  This is a medication that stimulates your testicles to make testosterone.   If he is interested, we can send in a script to his pharmacy.  We will need to check his labs again after he has been on the Clomid for one month.

## 2022-12-11 NOTE — Telephone Encounter (Signed)
Pt was informed, states he will call us back when he decided on what to do.

## 2023-10-05 ENCOUNTER — Other Ambulatory Visit: Payer: Self-pay | Admitting: *Deleted

## 2023-10-05 DIAGNOSIS — R972 Elevated prostate specific antigen [PSA]: Secondary | ICD-10-CM

## 2023-10-13 ENCOUNTER — Other Ambulatory Visit: Payer: Self-pay

## 2023-10-15 ENCOUNTER — Other Ambulatory Visit

## 2023-10-18 ENCOUNTER — Other Ambulatory Visit

## 2023-10-18 DIAGNOSIS — R972 Elevated prostate specific antigen [PSA]: Secondary | ICD-10-CM

## 2023-10-19 LAB — PSA: Prostate Specific Ag, Serum: 1.4 ng/mL (ref 0.0–4.0)

## 2023-10-25 NOTE — Progress Notes (Unsigned)
 10/26/2023 12:55 PM   Elijah Wagner 1955/06/07 [id number]  Referring provider: Donal Channing SQUIBB, FNP [address] [place],  [place] [address]  Urological history: 1. BPH with LU TS -PSA (09/2023) 1.4   2. Family history of prostate -brother with ? Prostate cancer   3. ED -contributing factors of age, BPH, former smoker and HTN - Testosterone  level (11/2022) 306 -Sildenafil  100 mg, on-demand dosing  No chief complaint on file.  HPI: Elijah Wagner is a 68 y.o. man who presents today for one year follow up.   Previous records reviewed.      PMH: Past Medical History:  Diagnosis Date   Hypertension     Surgical History: Past Surgical History:  Procedure Laterality Date   HYDROCELE EXCISION / REPAIR     TONSILLECTOMY      Home Medications:  Allergies as of 10/26/2023   No Known Allergies      Medication List        Accurate as of October 25, 2023 12:55 PM. If you have any questions, ask your nurse or doctor.          amLODipine 10 MG tablet Commonly known as: NORVASC Take 10 mg by mouth daily.   aspirin EC 81 MG tablet Take by mouth.   benzonatate  100 MG capsule Commonly known as: TESSALON  Take 1-2 capsules (100-200 mg total) by mouth 3 (three) times daily as needed for cough.   celecoxib 200 MG capsule Commonly known as: CELEBREX Take 200 mg by mouth 2 (two) times daily.   cetirizine  10 MG tablet Commonly known as: ZyrTEC  Allergy Take 1 tablet (10 mg total) by mouth daily.   promethazine -dextromethorphan 6.25-15 MG/5ML syrup Commonly known as: PROMETHAZINE -DM Take 5 mLs by mouth at bedtime as needed for cough.   pseudoephedrine  30 MG tablet Commonly known as: SUDAFED Take 1 tablet (30 mg total) by mouth every 8 (eight) hours as needed for congestion.   sildenafil  100 MG tablet Commonly known as: VIAGRA  Take 1 tablet (100 mg total) by mouth daily as needed for erectile dysfunction.   traMADol 50 MG tablet Commonly known as:  ULTRAM Take 50 mg by mouth every 6 (six) hours as needed.        Allergies: No Known Allergies  Family History: Family History  Problem Relation Age of Onset   Leukemia Father    Sarcoidosis Mother    Diabetes Mother    Bladder Cancer Neg Hx    Prostate cancer Neg Hx    Kidney cancer Neg Hx    Sudden Cardiac Death Neg Hx    Arrhythmia Neg Hx    Heart attack Neg Hx     Social History:  reports that he quit smoking about 10 years ago. His smoking use included cigarettes. He has never used smokeless tobacco. He reports that he does not drink alcohol and does not use drugs.  ROS: Pertinent ROS in HPI  Physical Exam: There were no vitals taken for this visit.  Constitutional:  Well nourished. Alert and oriented, No acute distress. HEENT: [place] AT, moist mucus membranes.  Trachea midline, no masses. Cardiovascular: No clubbing, cyanosis, or edema. Respiratory: Normal respiratory effort, no increased work of breathing. GI: Abdomen is soft, non tender, non distended, no abdominal masses. Liver and spleen not palpable.  No hernias appreciated.  Stool sample for occult testing is not indicated.   GU: No CVA tenderness.  No bladder fullness or masses.  Patient with circumcised/uncircumcised phallus. ***Foreskin easily retracted***  Urethral meatus is patent.  No penile discharge. No penile lesions or rashes. Scrotum without lesions, cysts, rashes and/or edema.  Testicles are located scrotally bilaterally. No masses are appreciated in the testicles. Left and right epididymis are normal. Rectal: Patient with  normal sphincter tone. Anus and perineum without scarring or rashes. No rectal masses are appreciated. Prostate is approximately *** grams, *** nodules are appreciated. Seminal vesicles are normal. Skin: No rashes, bruises or suspicious lesions. Lymph: No cervical or inguinal adenopathy. Neurologic: Grossly intact, no focal deficits, moving all 4 extremities. Psychiatric: Normal mood and  affect.   Laboratory Data: See EPIC and HPI I have reviewed the labs.   Pertinent Imaging: N/A  Assessment & Plan:    1. BPH with LUTS -PSA stable  -DRE benign *** -continue conservative management, avoiding bladder irritants and timed voiding's  2. Erectile dysfunction -  No follow-ups on file.  These notes generated with voice recognition software. I apologize for typographical errors.  CLOTILDA HELON RIGGERS  [facility] [address]  [address] [place], [place] [address] [phone]

## 2023-10-26 ENCOUNTER — Encounter: Payer: Self-pay | Admitting: Urology

## 2023-10-26 ENCOUNTER — Ambulatory Visit: Payer: Self-pay | Admitting: Urology

## 2023-10-26 VITALS — BP 153/83 | HR 74 | Ht 70.0 in | Wt 220.0 lb

## 2023-10-26 DIAGNOSIS — N529 Male erectile dysfunction, unspecified: Secondary | ICD-10-CM | POA: Diagnosis not present

## 2023-10-26 DIAGNOSIS — N138 Other obstructive and reflux uropathy: Secondary | ICD-10-CM | POA: Diagnosis not present

## 2023-10-26 DIAGNOSIS — N401 Enlarged prostate with lower urinary tract symptoms: Secondary | ICD-10-CM

## 2023-10-26 MED ORDER — SILDENAFIL CITRATE 100 MG PO TABS: 100.0000 mg | ORAL_TABLET | Freq: Every day | ORAL | 1 refills | Status: AC | PRN

## 2024-10-18 ENCOUNTER — Other Ambulatory Visit

## 2024-10-25 ENCOUNTER — Ambulatory Visit: Admitting: Urology
